# Patient Record
Sex: Male | Born: 1935 | Race: White | Hispanic: No | Marital: Married | Smoking: Former smoker
Health system: Southern US, Community
[De-identification: ages and names within clinical notes are randomized; demographics above are authoritative.]

## PROBLEM LIST (undated history)

## (undated) DIAGNOSIS — I209 Angina pectoris, unspecified: Secondary | ICD-10-CM

## (undated) DIAGNOSIS — I2510 Atherosclerotic heart disease of native coronary artery without angina pectoris: Secondary | ICD-10-CM

## (undated) DIAGNOSIS — N183 Chronic kidney disease, stage 3 unspecified: Secondary | ICD-10-CM

## (undated) DIAGNOSIS — I10 Essential (primary) hypertension: Secondary | ICD-10-CM

## (undated) DIAGNOSIS — K219 Gastro-esophageal reflux disease without esophagitis: Secondary | ICD-10-CM

## (undated) DIAGNOSIS — C8590 Non-Hodgkin lymphoma, unspecified, unspecified site: Secondary | ICD-10-CM

## (undated) DIAGNOSIS — I701 Atherosclerosis of renal artery: Secondary | ICD-10-CM

## (undated) DIAGNOSIS — J449 Chronic obstructive pulmonary disease, unspecified: Secondary | ICD-10-CM

## (undated) DIAGNOSIS — R42 Dizziness and giddiness: Secondary | ICD-10-CM

## (undated) HISTORY — PX: OTHER SURGICAL HISTORY: SHX169

## (undated) HISTORY — PX: LUNG SURGERY: SHX703

## (undated) HISTORY — DX: Chronic kidney disease, stage 3 (moderate): N18.3

## (undated) HISTORY — DX: Dizziness and giddiness: R42

## (undated) HISTORY — DX: Essential (primary) hypertension: I10

## (undated) HISTORY — PX: CORONARY ANGIOPLASTY WITH STENT PLACEMENT: SHX49

## (undated) HISTORY — DX: Gastro-esophageal reflux disease without esophagitis: K21.9

## (undated) HISTORY — DX: Non-Hodgkin lymphoma, unspecified, unspecified site: C85.90

## (undated) HISTORY — DX: Atherosclerotic heart disease of native coronary artery without angina pectoris: I25.10

## (undated) HISTORY — DX: Chronic kidney disease, stage 3 unspecified: N18.30

## (undated) HISTORY — PX: APPENDECTOMY: SHX54

## (undated) HISTORY — PX: HEMORROIDECTOMY: SUR656

## (undated) HISTORY — PX: MOUTH SURGERY: SHX715

## (undated) HISTORY — DX: Atherosclerosis of renal artery: I70.1

---
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
## 1998-08-10 HISTORY — PX: CATARACT EXTRACTION: SUR2

## 1999-04-20 ENCOUNTER — Encounter: Payer: Self-pay | Admitting: Emergency Medicine

## 1999-04-20 ENCOUNTER — Inpatient Hospital Stay (HOSPITAL_COMMUNITY): Admission: EM | Admit: 1999-04-20 | Discharge: 1999-04-22 | Payer: Self-pay | Admitting: Emergency Medicine

## 2000-02-18 ENCOUNTER — Encounter: Payer: Self-pay | Admitting: Family Medicine

## 2000-02-18 ENCOUNTER — Encounter (INDEPENDENT_AMBULATORY_CARE_PROVIDER_SITE_OTHER): Payer: Self-pay | Admitting: Specialist

## 2000-02-18 ENCOUNTER — Observation Stay (HOSPITAL_COMMUNITY): Admission: RE | Admit: 2000-02-18 | Discharge: 2000-02-19 | Payer: Self-pay | Admitting: Family Medicine

## 2000-02-19 ENCOUNTER — Encounter: Payer: Self-pay | Admitting: Family Medicine

## 2000-02-20 ENCOUNTER — Ambulatory Visit (HOSPITAL_COMMUNITY): Admission: RE | Admit: 2000-02-20 | Discharge: 2000-02-20 | Payer: Self-pay | Admitting: Family Medicine

## 2000-02-20 ENCOUNTER — Encounter: Payer: Self-pay | Admitting: Family Medicine

## 2000-02-24 ENCOUNTER — Encounter: Payer: Self-pay | Admitting: Family Medicine

## 2000-02-24 ENCOUNTER — Ambulatory Visit (HOSPITAL_COMMUNITY): Admission: RE | Admit: 2000-02-24 | Discharge: 2000-02-24 | Payer: Self-pay | Admitting: Family Medicine

## 2000-02-27 ENCOUNTER — Encounter: Payer: Self-pay | Admitting: Internal Medicine

## 2000-02-27 ENCOUNTER — Ambulatory Visit (HOSPITAL_COMMUNITY): Admission: RE | Admit: 2000-02-27 | Discharge: 2000-02-27 | Payer: Self-pay | Admitting: Internal Medicine

## 2000-04-02 ENCOUNTER — Encounter: Payer: Self-pay | Admitting: Thoracic Surgery

## 2000-04-05 ENCOUNTER — Encounter (INDEPENDENT_AMBULATORY_CARE_PROVIDER_SITE_OTHER): Payer: Self-pay | Admitting: Specialist

## 2000-04-05 ENCOUNTER — Inpatient Hospital Stay (HOSPITAL_COMMUNITY): Admission: RE | Admit: 2000-04-05 | Discharge: 2000-04-09 | Payer: Self-pay | Admitting: Thoracic Surgery

## 2000-04-05 ENCOUNTER — Encounter: Payer: Self-pay | Admitting: Thoracic Surgery

## 2000-04-06 ENCOUNTER — Encounter: Payer: Self-pay | Admitting: Thoracic Surgery

## 2000-04-07 ENCOUNTER — Encounter: Payer: Self-pay | Admitting: Thoracic Surgery

## 2000-04-08 ENCOUNTER — Encounter: Payer: Self-pay | Admitting: Thoracic Surgery

## 2000-04-14 ENCOUNTER — Encounter: Payer: Self-pay | Admitting: Thoracic Surgery

## 2000-04-14 ENCOUNTER — Encounter: Admission: RE | Admit: 2000-04-14 | Discharge: 2000-04-14 | Payer: Self-pay | Admitting: Thoracic Surgery

## 2000-05-05 ENCOUNTER — Encounter: Payer: Self-pay | Admitting: Thoracic Surgery

## 2000-05-05 ENCOUNTER — Encounter: Admission: RE | Admit: 2000-05-05 | Discharge: 2000-05-05 | Payer: Self-pay | Admitting: Thoracic Surgery

## 2000-07-07 ENCOUNTER — Encounter: Payer: Self-pay | Admitting: Thoracic Surgery

## 2000-07-07 ENCOUNTER — Encounter: Admission: RE | Admit: 2000-07-07 | Discharge: 2000-07-07 | Payer: Self-pay | Admitting: Thoracic Surgery

## 2000-10-20 ENCOUNTER — Encounter: Payer: Self-pay | Admitting: Thoracic Surgery

## 2000-10-20 ENCOUNTER — Encounter: Admission: RE | Admit: 2000-10-20 | Discharge: 2000-10-20 | Payer: Self-pay | Admitting: Thoracic Surgery

## 2001-01-28 ENCOUNTER — Encounter: Payer: Self-pay | Admitting: Thoracic Surgery

## 2001-01-28 ENCOUNTER — Encounter: Admission: RE | Admit: 2001-01-28 | Discharge: 2001-01-28 | Payer: Self-pay | Admitting: Thoracic Surgery

## 2003-01-18 ENCOUNTER — Encounter: Admission: RE | Admit: 2003-01-18 | Discharge: 2003-01-18 | Payer: Self-pay | Admitting: Family Medicine

## 2003-01-18 ENCOUNTER — Encounter: Payer: Self-pay | Admitting: Family Medicine

## 2003-11-28 ENCOUNTER — Encounter: Admission: RE | Admit: 2003-11-28 | Discharge: 2003-11-28 | Payer: Self-pay | Admitting: Nephrology

## 2003-12-09 ENCOUNTER — Ambulatory Visit (HOSPITAL_COMMUNITY): Admission: RE | Admit: 2003-12-09 | Discharge: 2003-12-09 | Payer: Self-pay | Admitting: Orthopedic Surgery

## 2004-01-18 ENCOUNTER — Emergency Department (HOSPITAL_COMMUNITY): Admission: EM | Admit: 2004-01-18 | Discharge: 2004-01-18 | Payer: Self-pay | Admitting: Emergency Medicine

## 2004-05-02 ENCOUNTER — Ambulatory Visit (HOSPITAL_COMMUNITY): Admission: RE | Admit: 2004-05-02 | Discharge: 2004-05-02 | Payer: Self-pay | Admitting: Ophthalmology

## 2004-06-24 ENCOUNTER — Ambulatory Visit: Payer: Self-pay | Admitting: Cardiology

## 2004-06-26 ENCOUNTER — Ambulatory Visit: Payer: Self-pay | Admitting: Cardiology

## 2004-06-27 ENCOUNTER — Ambulatory Visit (HOSPITAL_COMMUNITY): Admission: RE | Admit: 2004-06-27 | Discharge: 2004-06-28 | Payer: Self-pay | Admitting: Cardiology

## 2005-08-08 ENCOUNTER — Emergency Department (HOSPITAL_COMMUNITY): Admission: EM | Admit: 2005-08-08 | Discharge: 2005-08-08 | Payer: Self-pay | Admitting: Emergency Medicine

## 2006-08-27 ENCOUNTER — Ambulatory Visit: Payer: Self-pay

## 2008-01-24 ENCOUNTER — Encounter (INDEPENDENT_AMBULATORY_CARE_PROVIDER_SITE_OTHER): Payer: Self-pay | Admitting: Otolaryngology

## 2008-01-24 ENCOUNTER — Ambulatory Visit (HOSPITAL_BASED_OUTPATIENT_CLINIC_OR_DEPARTMENT_OTHER): Admission: RE | Admit: 2008-01-24 | Discharge: 2008-01-24 | Payer: Self-pay | Admitting: Otolaryngology

## 2008-02-08 ENCOUNTER — Ambulatory Visit: Payer: Self-pay | Admitting: Oncology

## 2008-02-17 LAB — COMPREHENSIVE METABOLIC PANEL
ALT: 13 U/L (ref 0–53)
AST: 15 U/L (ref 0–37)
Albumin: 4.2 g/dL (ref 3.5–5.2)
CO2: 29 mEq/L (ref 19–32)
Calcium: 9.3 mg/dL (ref 8.4–10.5)
Chloride: 103 mEq/L (ref 96–112)
Potassium: 4.5 mEq/L (ref 3.5–5.3)
Sodium: 141 mEq/L (ref 135–145)
Total Protein: 6.8 g/dL (ref 6.0–8.3)

## 2008-02-17 LAB — CBC WITH DIFFERENTIAL/PLATELET
BASO%: 0.4 % (ref 0.0–2.0)
Basophils Absolute: 0 10*3/uL (ref 0.0–0.1)
EOS%: 1.3 % (ref 0.0–7.0)
HCT: 38.5 % — ABNORMAL LOW (ref 38.7–49.9)
MCH: 34.5 pg — ABNORMAL HIGH (ref 28.0–33.4)
MCHC: 35 g/dL (ref 32.0–35.9)
MONO#: 0.6 10*3/uL (ref 0.1–0.9)
NEUT#: 4.9 10*3/uL (ref 1.5–6.5)
NEUT%: 71.4 % (ref 40.0–75.0)
Platelets: 187 10*3/uL (ref 145–400)
WBC: 6.9 10*3/uL (ref 4.0–10.0)
lymph#: 1.2 10*3/uL (ref 0.9–3.3)

## 2008-02-17 LAB — LACTATE DEHYDROGENASE: LDH: 159 U/L (ref 94–250)

## 2008-02-24 ENCOUNTER — Ambulatory Visit (HOSPITAL_COMMUNITY): Admission: RE | Admit: 2008-02-24 | Discharge: 2008-02-24 | Payer: Self-pay | Admitting: Oncology

## 2008-06-15 ENCOUNTER — Ambulatory Visit: Payer: Self-pay | Admitting: Oncology

## 2008-06-19 LAB — COMPREHENSIVE METABOLIC PANEL
ALT: 8 U/L (ref 0–53)
AST: 16 U/L (ref 0–37)
CO2: 31 mEq/L (ref 19–32)
Glucose, Bld: 92 mg/dL (ref 70–99)
Sodium: 143 mEq/L (ref 135–145)
Total Bilirubin: 0.8 mg/dL (ref 0.3–1.2)
Total Protein: 6.9 g/dL (ref 6.0–8.3)

## 2008-06-19 LAB — CBC WITH DIFFERENTIAL/PLATELET
Basophils Absolute: 0 10*3/uL (ref 0.0–0.1)
EOS%: 1.2 % (ref 0.0–7.0)
Eosinophils Absolute: 0.1 10*3/uL (ref 0.0–0.5)
HGB: 13.9 g/dL (ref 13.0–17.1)
LYMPH%: 26.2 % (ref 14.0–48.0)
MONO#: 0.5 10*3/uL (ref 0.1–0.9)
NEUT%: 63.6 % (ref 40.0–75.0)
RBC: 4.05 10*6/uL — ABNORMAL LOW (ref 4.20–5.71)
WBC: 6 10*3/uL (ref 4.0–10.0)

## 2008-12-17 ENCOUNTER — Ambulatory Visit: Payer: Self-pay | Admitting: Oncology

## 2008-12-19 LAB — CBC WITH DIFFERENTIAL/PLATELET
Basophils Absolute: 0 10*3/uL (ref 0.0–0.1)
EOS%: 1.5 % (ref 0.0–7.0)
HGB: 13.7 g/dL (ref 13.0–17.1)
MONO#: 0.3 10*3/uL (ref 0.1–0.9)
MONO%: 5.3 % (ref 0.0–14.0)
NEUT#: 4.8 10*3/uL (ref 1.5–6.5)
NEUT%: 73.2 % (ref 39.0–75.0)
RBC: 3.97 10*6/uL — ABNORMAL LOW (ref 4.20–5.82)

## 2008-12-19 LAB — COMPREHENSIVE METABOLIC PANEL
ALT: 12 U/L (ref 0–53)
AST: 15 U/L (ref 0–37)
Albumin: 4.1 g/dL (ref 3.5–5.2)
Alkaline Phosphatase: 69 U/L (ref 39–117)
Glucose, Bld: 150 mg/dL — ABNORMAL HIGH (ref 70–99)
Total Protein: 6.6 g/dL (ref 6.0–8.3)

## 2008-12-19 LAB — LACTATE DEHYDROGENASE: LDH: 164 U/L (ref 94–250)

## 2009-05-20 ENCOUNTER — Ambulatory Visit: Payer: Self-pay | Admitting: Oncology

## 2009-05-22 LAB — CBC WITH DIFFERENTIAL/PLATELET
BASO%: 0.5 % (ref 0.0–2.0)
Basophils Absolute: 0 10*3/uL (ref 0.0–0.1)
EOS%: 1.4 % (ref 0.0–7.0)
Eosinophils Absolute: 0.1 10*3/uL (ref 0.0–0.5)
LYMPH%: 21.4 % (ref 14.0–49.0)
MCH: 34.3 pg — ABNORMAL HIGH (ref 27.2–33.4)
MCHC: 34.2 g/dL (ref 32.0–36.0)
MONO#: 0.6 10*3/uL (ref 0.1–0.9)
MONO%: 9 % (ref 0.0–14.0)
NEUT%: 67.7 % (ref 39.0–75.0)
RBC: 4 10*6/uL — ABNORMAL LOW (ref 4.20–5.82)

## 2009-05-22 LAB — COMPREHENSIVE METABOLIC PANEL
AST: 16 U/L (ref 0–37)
Alkaline Phosphatase: 73 U/L (ref 39–117)
CO2: 29 mEq/L (ref 19–32)
Glucose, Bld: 124 mg/dL — ABNORMAL HIGH (ref 70–99)
Potassium: 4.3 mEq/L (ref 3.5–5.3)
Total Bilirubin: 0.7 mg/dL (ref 0.3–1.2)

## 2009-05-22 LAB — LACTATE DEHYDROGENASE: LDH: 167 U/L (ref 94–250)

## 2009-11-19 ENCOUNTER — Ambulatory Visit: Payer: Self-pay | Admitting: Oncology

## 2009-11-21 LAB — CBC WITH DIFFERENTIAL/PLATELET
Eosinophils Absolute: 0.1 10*3/uL (ref 0.0–0.5)
MCH: 34.3 pg — ABNORMAL HIGH (ref 27.2–33.4)
MCHC: 33.8 g/dL (ref 32.0–36.0)
MCV: 101.6 fL — ABNORMAL HIGH (ref 79.3–98.0)
RDW: 14.3 % (ref 11.0–14.6)
lymph#: 1 10*3/uL (ref 0.9–3.3)

## 2009-11-21 LAB — COMPREHENSIVE METABOLIC PANEL
ALT: 9 U/L (ref 0–53)
AST: 14 U/L (ref 0–37)
Albumin: 4.4 g/dL (ref 3.5–5.2)
CO2: 26 mEq/L (ref 19–32)
Calcium: 9.4 mg/dL (ref 8.4–10.5)
Creatinine, Ser: 2.35 mg/dL — ABNORMAL HIGH (ref 0.40–1.50)
Glucose, Bld: 94 mg/dL (ref 70–99)
Potassium: 4.3 mEq/L (ref 3.5–5.3)
Sodium: 139 mEq/L (ref 135–145)
Total Protein: 6.9 g/dL (ref 6.0–8.3)

## 2010-01-21 ENCOUNTER — Ambulatory Visit: Payer: Self-pay | Admitting: Oncology

## 2010-01-23 LAB — CBC WITH DIFFERENTIAL/PLATELET
BASO%: 0.5 % (ref 0.0–2.0)
EOS%: 2.1 % (ref 0.0–7.0)
LYMPH%: 21.5 % (ref 14.0–49.0)
MCH: 34.7 pg — ABNORMAL HIGH (ref 27.2–33.4)
MCHC: 34.6 g/dL (ref 32.0–36.0)
MCV: 100.4 fL — ABNORMAL HIGH (ref 79.3–98.0)
MONO#: 0.7 10*3/uL (ref 0.1–0.9)
MONO%: 10 % (ref 0.0–14.0)
NEUT#: 4.5 10*3/uL (ref 1.5–6.5)
NEUT%: 65.9 % (ref 39.0–75.0)

## 2010-01-23 LAB — COMPREHENSIVE METABOLIC PANEL
ALT: 9 U/L (ref 0–53)
AST: 16 U/L (ref 0–37)
BUN: 26 mg/dL — ABNORMAL HIGH (ref 6–23)
CO2: 26 mEq/L (ref 19–32)
Calcium: 8.9 mg/dL (ref 8.4–10.5)
Glucose, Bld: 86 mg/dL (ref 70–99)
Potassium: 4.5 mEq/L (ref 3.5–5.3)
Sodium: 142 mEq/L (ref 135–145)

## 2010-02-05 ENCOUNTER — Ambulatory Visit (HOSPITAL_COMMUNITY): Admission: RE | Admit: 2010-02-05 | Discharge: 2010-02-05 | Payer: Self-pay | Admitting: Oncology

## 2010-05-20 ENCOUNTER — Ambulatory Visit: Payer: Self-pay | Admitting: Oncology

## 2010-05-22 LAB — CBC WITH DIFFERENTIAL/PLATELET
BASO%: 0.3 % (ref 0.0–2.0)
Basophils Absolute: 0 10*3/uL (ref 0.0–0.1)
EOS%: 1 % (ref 0.0–7.0)
HGB: 13.6 g/dL (ref 13.0–17.1)
MCV: 100.2 fL — ABNORMAL HIGH (ref 79.3–98.0)
MONO#: 0.6 10*3/uL (ref 0.1–0.9)
MONO%: 8.8 % (ref 0.0–14.0)
RDW: 14 % (ref 11.0–14.6)
WBC: 6.4 10*3/uL (ref 4.0–10.3)

## 2010-05-22 LAB — COMPREHENSIVE METABOLIC PANEL
ALT: <8 U/L (ref 0–53)
AST: 16 U/L (ref 0–37)
Alkaline Phosphatase: 73 U/L (ref 39–117)
BUN: 29 mg/dL — ABNORMAL HIGH (ref 6–23)
Chloride: 101 mEq/L (ref 96–112)
Creatinine, Ser: 1.62 mg/dL — ABNORMAL HIGH (ref 0.40–1.50)
Sodium: 141 mEq/L (ref 135–145)
Total Bilirubin: 0.7 mg/dL (ref 0.3–1.2)

## 2010-05-22 LAB — LACTATE DEHYDROGENASE: LDH: 170 U/L (ref 94–250)

## 2010-08-22 ENCOUNTER — Inpatient Hospital Stay (HOSPITAL_COMMUNITY)
Admission: EM | Admit: 2010-08-22 | Discharge: 2010-08-25 | Payer: Self-pay | Source: Home / Self Care | Attending: Internal Medicine | Admitting: Internal Medicine

## 2010-08-25 LAB — URINALYSIS, ROUTINE W REFLEX MICROSCOPIC
Bilirubin Urine: NEGATIVE
Hgb urine dipstick: NEGATIVE
Ketones, ur: NEGATIVE mg/dL
Leukocytes, UA: NEGATIVE
Nitrite: NEGATIVE
Protein, ur: 30 mg/dL — AB
Specific Gravity, Urine: 1.013 (ref 1.005–1.030)
Urine Glucose, Fasting: 100 mg/dL — AB
Urobilinogen, UA: 0.2 mg/dL (ref 0.0–1.0)
pH: 6 (ref 5.0–8.0)

## 2010-08-25 LAB — CBC
HCT: 41.6 % (ref 39.0–52.0)
Hemoglobin: 13.7 g/dL (ref 13.0–17.0)
MCH: 33.3 pg (ref 26.0–34.0)
MCHC: 32.9 g/dL (ref 30.0–36.0)
MCV: 101.2 fL — ABNORMAL HIGH (ref 78.0–100.0)
Platelets: 183 10*3/uL (ref 150–400)
RBC: 4.11 MIL/uL — ABNORMAL LOW (ref 4.22–5.81)
RDW: 13.5 % (ref 11.5–15.5)
WBC: 6.9 10*3/uL (ref 4.0–10.5)

## 2010-08-25 LAB — BASIC METABOLIC PANEL
BUN: 16 mg/dL (ref 6–23)
CO2: 32 mEq/L (ref 19–32)
Calcium: 9.1 mg/dL (ref 8.4–10.5)
Chloride: 101 mEq/L (ref 96–112)
Creatinine, Ser: 1.48 mg/dL (ref 0.4–1.5)
GFR calc Af Amer: 54 mL/min — ABNORMAL LOW (ref >60)
GFR calc non Af Amer: 45 mL/min — ABNORMAL LOW (ref >60)
Glucose, Bld: 95 mg/dL (ref 70–99)
Potassium: 3.9 mEq/L (ref 3.5–5.1)
Sodium: 139 mEq/L (ref 135–145)

## 2010-08-25 LAB — POCT I-STAT, CHEM 8
BUN: 26 mg/dL — ABNORMAL HIGH (ref 6–23)
Calcium, Ion: 1.1 mmol/L — ABNORMAL LOW (ref 1.12–1.32)
Chloride: 106 mEq/L (ref 96–112)
Creatinine, Ser: 1.6 mg/dL — ABNORMAL HIGH (ref 0.4–1.5)
Glucose, Bld: 123 mg/dL — ABNORMAL HIGH (ref 70–99)
HCT: 42 % (ref 39.0–52.0)
Hemoglobin: 14.3 g/dL (ref 13.0–17.0)
Potassium: 4.1 mEq/L (ref 3.5–5.1)
Sodium: 138 mEq/L (ref 135–145)
TCO2: 28 mmol/L (ref 0–100)

## 2010-08-25 LAB — DIFFERENTIAL
Basophils Absolute: 0 10*3/uL (ref 0.0–0.1)
Basophils Relative: 0 % (ref 0–1)
Eosinophils Absolute: 0 10*3/uL (ref 0.0–0.7)
Eosinophils Relative: 0 % (ref 0–5)
Lymphocytes Relative: 12 % (ref 12–46)
Lymphs Abs: 0.9 10*3/uL (ref 0.7–4.0)
Monocytes Absolute: 0.4 10*3/uL (ref 0.1–1.0)
Monocytes Relative: 5 % (ref 3–12)
Neutro Abs: 5.6 10*3/uL (ref 1.7–7.7)
Neutrophils Relative %: 82 % — ABNORMAL HIGH (ref 43–77)

## 2010-08-25 LAB — URINE MICROSCOPIC-ADD ON

## 2010-08-27 LAB — BASIC METABOLIC PANEL
BUN: 23 mg/dL (ref 6–23)
CO2: 32 mEq/L (ref 19–32)
Calcium: 9.2 mg/dL (ref 8.4–10.5)
Chloride: 97 mEq/L (ref 96–112)
Creatinine, Ser: 1.64 mg/dL — ABNORMAL HIGH (ref 0.4–1.5)
GFR calc Af Amer: 48 mL/min — ABNORMAL LOW (ref >60)
GFR calc non Af Amer: 40 mL/min — ABNORMAL LOW (ref >60)
Glucose, Bld: 89 mg/dL (ref 70–99)
Potassium: 3.8 mEq/L (ref 3.5–5.1)
Sodium: 136 mEq/L (ref 135–145)

## 2010-08-27 LAB — CBC
HCT: 39.6 % (ref 39.0–52.0)
Hemoglobin: 12.9 g/dL — ABNORMAL LOW (ref 13.0–17.0)
MCH: 32.9 pg (ref 26.0–34.0)
MCHC: 32.6 g/dL (ref 30.0–36.0)
MCV: 101 fL — ABNORMAL HIGH (ref 78.0–100.0)
Platelets: 197 10*3/uL (ref 150–400)
RBC: 3.92 MIL/uL — ABNORMAL LOW (ref 4.22–5.81)
RDW: 13.4 % (ref 11.5–15.5)
WBC: 6 10*3/uL (ref 4.0–10.5)

## 2010-08-27 LAB — PHOSPHORUS: Phosphorus: 3.5 mg/dL (ref 2.3–4.6)

## 2010-08-27 LAB — MAGNESIUM: Magnesium: 2.1 mg/dL (ref 1.5–2.5)

## 2010-08-28 ENCOUNTER — Ambulatory Visit: Payer: Self-pay | Admitting: Cardiology

## 2010-09-01 NOTE — H&P (Addendum)
NAMECHEYNE, [name] NO.:  [id number]  MEDICAL RECORD NO.:  [id number]          PATIENT TYPE:  EMS  LOCATION:  MAJO                         FACILITY:  [facility]  PHYSICIAN:  Houston Siren, MD           DATE OF BIRTH:  1919-01-17  DATE OF ADMISSION:  08/22/2010 DATE OF DISCHARGE:                             HISTORY & PHYSICAL   PRIMARY CARE PHYSICIAN:  Benita Stabile, M.D.  REASON FOR ADMISSION:  Vertigo.  ADVANCED DIRECTIVE:  Full Code.  HISTORY OF PRESENT ILLNESS:  This is an 77 year old male with a history of hypertension, GERD, gout, prior vertigo who presented to the emergency room with acute onset of vertigo and nausea.  He had sudden onset of vertigo with some nausea but no vomiting.  He denies any headache but has trouble focusing.  He noted that he had some nystagmus and definitely worsened vertigo with head movements.  He did not have any upper respiratory symptoms prior to this.  He denied any focal weakness or slurred speech.  He has no headache, palpitation, chest pain, shortness of breath, or any other symptomatology.  A year ago, he had similar symptoms and was given intravenous in the emergency room and sent home as well.  Workup in the emergency room included an EKG which shows sinus rhythm, QTC of 420 milliseconds.  A head CT only shows small- vessel disease.  He does have slight elevation of his creatinine of 1.6. He has normal white count and normal hemoglobin.  Hospitalist was asked to admit the patient because he is unable to ambulate after Antivert and Zofran given.  PAST MEDICAL HISTORY:  Hypertension, acid reflux, arrhythmia, gout, prostatic hypertrophy.  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  He is married with children.  He denied current tobacco or alcohol or drug use.  ALLERGIES:  PENICILLIN.  CURRENT MEDICATIONS: 1. Lisinopril. 2. Omeprazole. 3. Colchicine. 4. Tamsulosin. 5. Terazosin. 6.  Hydrochlorothiazide. 7. Norvasc. 8. Lopressor. 9. Allopurinol. 10.Aspirin. 11.Ranitidine. 12.Neurontin.  REVIEW OF SYSTEMS:  Otherwise unremarkable.  PHYSICAL EXAM:  VITAL SIGNS:  Blood pressure 140/70, heart rate of 75, respiratory rate of 18, temperature 97.5. GENERAL APPEARANCE:  Exam shows that he is alert and oriented and conversing.  He has no nystagmus.  He has facial symmetry and speech is fluent.  Tongue is midline.  Uvula elevated with phonation. NECK:  He has no carotid bruit.  Neck is supple. CARDIAC:  Exam reveals S1, S2 regular.  I do not hear any murmur, rub, or gallop. LUNGS:  Clear. ABDOMINAL:  Exam is soft, nondistended, nontender.  No rebound.  No palpable mass.  No ascites. EXTREMITIES:  Shows no edema.  No calf tenderness.  Good distal pulses bilaterally. NEUROLOGICAL:  Exam shows finger-to-nose and heel-to-shin are intact. He has good strength bilaterally.  Babinski is flexor, and his strength is equal bilaterally.  OBJECTIVE FINDINGS:  White count of 6.9 thousand, hemoglobin of 13.7. Head CT showed vascular calcification and small-vessel disease. Creatinine of 1.6, BUN of 26, hemoglobin of 14.3.  EKG shows sinus rhythm  with no acute ST-T changes.  IMPRESSIONS:  This is an 77 year old male with prior history of vertigo presents most likely peripheral vertigo.  He does have nystagmus with head movement and worsening symptoms with head movement as well.  We will admit him because he cannot ambulate.  We will treat him symptomatically with Antivert and benzodiazepines as needed.  I will continue his aspirin.  It should be noted that his cerebellar exam is intact.  As soon as his medications reconciled, we will continue them as well.  I noted that he is on 2 alpha blockers, and we will only continue him on one.  He also has elevation of creatinine.  We will give him intravenous fluid, and we will hold his hydrochlorothiazide.  If his creatinine does not  improve, will then initiate further workups.  We also should adjust his allopurinol and hold his colchicine due to elevation of his creatinine.  He is stable and will be admitted to [facility].  CODE STATUS:  He is a Full Code.     Houston Siren, MD     PL/MEDQ  D:  08/22/2010  T:  08/22/2010  Job:  [id number]  Electronically Signed by Houston Siren  on 09/01/2010 09:25:21 PM

## 2010-09-03 NOTE — Discharge Summary (Addendum)
[name], [name]                 ACCOUNT NO.:  [id number]  MEDICAL RECORD NO.:  [id number]          PATIENT TYPE:  INP  LOCATION:  3707                         FACILITY:  [facility]  PHYSICIAN:  Rock Nephew, MD       DATE OF BIRTH:  Apr 30, 1919  DATE OF ADMISSION:  08/22/2010 DATE OF DISCHARGE:  08/25/2010                        DISCHARGE SUMMARY - REFERRING   PRIMARY CARE PHYSICIAN:  Dr. Peggye Form.  DISCHARGE DIAGNOSES: 1. Vertigo most likely exacerbated by taking an extra dose of     gabapentin. 2. History of hypertension, controlled. 3. History of GERD. 4. History of gout. 5. History of BPH. 6. History of chronic kidney disease stage III with mild exacerbation. 7. History of coronary artery disease.  DISCHARGE MEDICATIONS: 1. Gabapentin 100 mg 2 tablets daily at bedtime q.h.s. for neuropathy. 2. Imdur 30 mg p.o. daily. 3. Senna 2 tablets by mouth daily. 4. Allopurinol 100 mg p.o. every morning. 5. Amlodipine 2.5 mg p.o. daily. 6. Aspirin 81 mg p.o. daily. 7. Colchicine 0.6 mg 1 tablet by mouth twice daily. 8. As needed hydrochlorothiazide 12.5 mg by mouth every other day. 9. Metoprolol 25 mg 1/2 capsule by mouth daily. 10.Multivitamins 1 tablet by mouth daily at bedtime. 11.Omeprazole 20 mg 1 capsule by mouth daily at bedtime. 12.Ranitidine 150 mg p.o. twice daily. 13.Tamsulosin 0.4 mg 1 tablet by mouth daily at bedtime. 14.Terazosin 5 mg p.o. daily at bedtime. 15.Tylenol Extra Strength 500 mg 1 tablet by mouth daily at bedtime. 16.Vitamin D3 over-the-counter 1 tablet by mouth p.o. daily.  DISPOSITION:  The patient is discharged home.  The patient will have home safety evaluation at home.  PATIENT'S DIET:  Heart-healthy.  PROCEDURES PERFORMED:  Patient had a CT scan of the head which showed small vessel disease.  No intracranial hemorrhage or CT evidence of large acute infarct, vascular calcifications.  CONSULTATIONS:  On this case none.  FOLLOW-UP:   The patient should follow up with Dr. Roger Shelter in about 1 week.  The patient should also follow up with Dr. Lupe Carney is about 1-2 weeks.  The patient also should follow up with Dr. Arrie Aran in 1-3 weeks.  INITIAL HISTORY AND PHYSICAL/CHIEF COMPLAINT:  Vertigo.  77- year-old male with a history of hypertension, GERD, gout, prior vertigo who presented to the emergency room with acute onset of vertigo and nausea.  He denies any headache or any trouble focusing. In speaking to the patient, the patient reported that he thinks he took he extra dose of gabapentin in the morning.  The patient's vertigo resolved on its own.  HOSPITAL COURSE: 1. Vertigo.  Again the patient's gabapentin dose was decreased.  He     takes gabapentin for peripheral neuropathy.  The patient remained     vertigo free. 2. Hypertension.  Patient's blood pressure was elevated.  Initially     the patient's hydrochlorothiazide was withheld.  The patient's     lisinopril was also withheld.  Looking at the patient's creatinine,     patient has an elevated creatinine.  Patient's creatinine was     initially 1.6.  Then it  was 1.48, 1.6.  That is what appears to be     around baseline creatinine for the patient.  Patient most likely     has chronic kidney disease stage III.  Currently I have withheld     the patient's lisinopril, and has transitioned him over to Imdur.     The patient could most likely be transitioned back to lisinopril if     Dr. Arrie Aran thinks it is okay.  I have urged the patient to     follow up with Dr. Arrie Aran in about 1-3 weeks. 3. GERD.  Patient was continued on PPI. 4. Gout.  Patient continued on allopurinol.  Patient also became a     little bit constipated.  He thought it was secondary to     allopurinol, and that is why he was given senna and resolved the     problem. 5. BPH.  The patient takes Flomax as well as Hytrin. 6. Chronic kidney disease stage III.  Again  patient's creatinine     appears relatively stable.  Patient should have an outpatient     follow-up with Dr. Arrie Aran. 7. Coronary artery disease.  Patient is on aspirin as well as a beta     blocker.  He received heparin for DVT prophylaxis.  Please note that this is not an official document until electronically signed     Rock Nephew, MD     NH/MEDQ  D:  08/25/2010  T:  08/25/2010  Job:  [id number]  cc:   Peggye Form, M.D. Colleen Can. Deborah Chalk, M.D. Terrial Rhodes, M.D.  Electronically Signed by Rock Nephew MD on 09/03/2010 06:24:57 PM

## 2010-09-04 ENCOUNTER — Ambulatory Visit: Payer: Self-pay | Admitting: Cardiovascular Disease

## 2010-09-20 ENCOUNTER — Emergency Department (HOSPITAL_COMMUNITY)
Admission: EM | Admit: 2010-09-20 | Discharge: 2010-09-20 | Disposition: A | Discharge status: Home / Self Care | Payer: Medicare Other | Attending: Emergency Medicine | Admitting: Emergency Medicine

## 2010-09-20 ENCOUNTER — Emergency Department (HOSPITAL_COMMUNITY): Payer: Medicare Other

## 2010-09-20 DIAGNOSIS — K219 Gastro-esophageal reflux disease without esophagitis: Secondary | ICD-10-CM | POA: Insufficient documentation

## 2010-09-20 DIAGNOSIS — S99929A Unspecified injury of unspecified foot, initial encounter: Secondary | ICD-10-CM | POA: Insufficient documentation

## 2010-09-20 DIAGNOSIS — M79609 Pain in unspecified limb: Secondary | ICD-10-CM | POA: Insufficient documentation

## 2010-09-20 DIAGNOSIS — Z862 Personal history of diseases of the blood and blood-forming organs and certain disorders involving the immune mechanism: Secondary | ICD-10-CM | POA: Insufficient documentation

## 2010-09-20 DIAGNOSIS — Y92009 Unspecified place in unspecified non-institutional (private) residence as the place of occurrence of the external cause: Secondary | ICD-10-CM | POA: Insufficient documentation

## 2010-09-20 DIAGNOSIS — S8990XA Unspecified injury of unspecified lower leg, initial encounter: Secondary | ICD-10-CM | POA: Insufficient documentation

## 2010-09-20 DIAGNOSIS — M7989 Other specified soft tissue disorders: Secondary | ICD-10-CM | POA: Insufficient documentation

## 2010-09-20 DIAGNOSIS — I10 Essential (primary) hypertension: Secondary | ICD-10-CM | POA: Insufficient documentation

## 2010-09-20 DIAGNOSIS — Z79899 Other long term (current) drug therapy: Secondary | ICD-10-CM | POA: Insufficient documentation

## 2010-09-20 DIAGNOSIS — Z8639 Personal history of other endocrine, nutritional and metabolic disease: Secondary | ICD-10-CM | POA: Insufficient documentation

## 2010-09-20 DIAGNOSIS — M19079 Primary osteoarthritis, unspecified ankle and foot: Secondary | ICD-10-CM | POA: Insufficient documentation

## 2010-09-20 DIAGNOSIS — Z7982 Long term (current) use of aspirin: Secondary | ICD-10-CM | POA: Insufficient documentation

## 2010-09-20 DIAGNOSIS — IMO0002 Reserved for concepts with insufficient information to code with codable children: Secondary | ICD-10-CM | POA: Insufficient documentation

## 2010-10-14 ENCOUNTER — Ambulatory Visit: Payer: Self-pay | Admitting: *Deleted

## 2010-10-14 ENCOUNTER — Ambulatory Visit (INDEPENDENT_AMBULATORY_CARE_PROVIDER_SITE_OTHER): Payer: Medicare Other | Admitting: Cardiology

## 2010-10-14 DIAGNOSIS — I2510 Atherosclerotic heart disease of native coronary artery without angina pectoris: Secondary | ICD-10-CM

## 2010-10-14 DIAGNOSIS — I10 Essential (primary) hypertension: Secondary | ICD-10-CM

## 2010-11-17 ENCOUNTER — Telehealth: Payer: Self-pay | Admitting: Cardiology

## 2010-11-17 NOTE — Telephone Encounter (Signed)
CALL PT REGARDING A QUESTION ABOUT HIS MEDS.

## 2010-11-17 NOTE — Telephone Encounter (Signed)
Pt called, wanting to know if Dr. Deborah Chalk stopped Lisinopril.  RN reviewed records and could not see why Lisinopril was stopped.  [facility] will [facility] to see if they stopped Lis[facility]ril.  Pt will call back with any concerns.

## 2010-12-03 ENCOUNTER — Encounter (HOSPITAL_BASED_OUTPATIENT_CLINIC_OR_DEPARTMENT_OTHER): Payer: Medicare Other | Admitting: Oncology

## 2010-12-03 ENCOUNTER — Other Ambulatory Visit: Payer: Self-pay | Admitting: Oncology

## 2010-12-03 DIAGNOSIS — N189 Chronic kidney disease, unspecified: Secondary | ICD-10-CM

## 2010-12-03 DIAGNOSIS — C8291 Follicular lymphoma, unspecified, lymph nodes of head, face, and neck: Secondary | ICD-10-CM

## 2010-12-03 LAB — CBC WITH DIFFERENTIAL/PLATELET
Eosinophils Absolute: 0.1 10*3/uL (ref 0.0–0.5)
HCT: 38.2 % — ABNORMAL LOW (ref 38.4–49.9)
HGB: 12.9 g/dL — ABNORMAL LOW (ref 13.0–17.1)
LYMPH%: 27.5 % (ref 14.0–49.0)
MCHC: 33.8 g/dL (ref 32.0–36.0)
MCV: 100.7 fL — ABNORMAL HIGH (ref 79.3–98.0)
MONO#: 0.5 10*3/uL (ref 0.1–0.9)
RDW: 14.6 % (ref 11.0–14.6)

## 2010-12-03 LAB — COMPREHENSIVE METABOLIC PANEL
CO2: 28 mEq/L (ref 19–32)
Potassium: 4.4 mEq/L (ref 3.5–5.3)
Total Bilirubin: 0.5 mg/dL (ref 0.3–1.2)
Total Protein: 6.7 g/dL (ref 6.0–8.3)

## 2010-12-23 NOTE — Op Note (Signed)
NAME:  [name], [name]                 ACCOUNT NO.:  [id number]   MEDICAL RECORD NO.:  [id number]          PATIENT TYPE:  AMB   LOCATION:  DSC                          FACILITY:  [facility]   PHYSICIAN:  Christopher E. Ezzard Standing, M.D.DATE OF BIRTH:  1918-10-15   DATE OF PROCEDURE:  01/24/2008  DATE OF DISCHARGE:                               OPERATIVE REPORT   PREOPERATIVE DIAGNOSIS:  Right floor of mouth nodule 1 x 1.5 cm size.   POSTOPERATIVE DIAGNOSIS:  Right floor of mouth nodule 1 x 1.5 cm size.   OPERATION:  Excision of right floor of mouth nodule with sialolithotomy.   SURGEON:  Kristine Garbe. Ezzard Standing, MD   ANESTHESIA:  Local, 1% Xylocaine with epinephrine.   COMPLICATIONS:  None.   BRIEF CLINICAL NOTE:  [name] is an 77 year old gentleman who has  noted a mass or nodule in the right side of his floor of mouth now for  several months.  He feels like it interferes with his speech a little  bit and is also bothersome.  On exam in the office, he has approximately  1-1/2 cm palpable nodule in the right floor of mouth along the region of  the right submandibular duct.  He is taken to the operating room at this  time for excision of right floor of mouth nodule.   DESCRIPTION OF PROCEDURE:  The patient was brought back to the operating  room.  The floor of mouth was injected with approximately 4 mL Xylocaine  with epinephrine for local anesthetic and then using cautery to cut the  mucosa, the mucosa was opened up overlying the nodule and the nodule was  dissected out, removed, and sent to pathology.  Hemostasis was obtained  with cautery.  No closure was performed.  On clinical exam, the patient  had an opening of the submandibular duct proximal to the nodule where  apparently the duct had visualized.  There was no swelling of the  submandibular gland prior to procedure.  The patient tolerated this  well.  He was subsequently discharged home.   DISPOSITION:  The patient is discharged  home on Keflex 500 mg b.i.d. for  5 days, Tylenol p.r.n. pain.  Instructed on rinsing the mouth when  eating and will have follow-up in my office in 6 days for recheck and  review pathology.           ______________________________  Kristine Garbe Ezzard Standing, M.D.     CEN/MEDQ  D:  01/24/2008  T:  01/25/2008  Job:  [id number]   cc:   L. Lupe Carney, M.D.

## 2010-12-26 NOTE — Op Note (Signed)
[place]  Patient:    [name], [name]                        MRN: [id number] Proc. Date: 04/05/00 Adm. Date:  [id number] Attending:  Cameron Proud CC:         Norton Blizzard, M.D. x 2             Clinton D. Maple Hudson, M.D.                           Operative Report  PREOPERATIVE DIAGNOSIS:  Left upper lobe lesion.  POSTOPERATIVE DIAGNOSIS:  Left upper lobe lesion.  PROCEDURE:  Left VATS, wedge resection of left upper lobe lesion.  SURGEON:  D. Karle Plumber, M.D.  ASSISTANT:  Areta Haber, P.A.  ANESTHESIA:  General anesthesia.  INDICATIONS:  This patient had a previous left upper lobe lesion and he had been a chronic smoker that had quit several years earlier.  Needle biopsy of this lesion revealed benign, but they did not feel they had gotten a good biopsy of this.  Because of this, it was elected to do a bronchial mediastinoscopy and then a wedge resection, however, in reviewing the chest x-rays after the needle biopsy, the lesion was stable and there was minimal adenopathy, so it was decided to go ahead and just do a straight wedge resection of this.  DESCRIPTION OF PROCEDURE:  After general anesthesia, the patient was turned to the left lateral thoracotomy position and was prepped and draped in the usual sterile fashion.  Two trocar sites were made in the anterior and posterior axillary line at the seventh intercostal space and the midaxillary line at the fifth intercostal space.  The trocars were inserted, the lesion was seen in the anterior segment of the left upper lobe.  Through the midtrocar site, it was grasped with a Duvall lung clamp and then resected with the autosuture 60 stapler with three applications of the autosuture 60 stapler and one application of the 45.  Then working through the mid trocar site, a 10R node was removed.  Frozen section revealed a probable inflammatory lesion.  There was some question that  it may be bronchovial cancer, but since we had a generous wedge, it was decided to stop there.  Two chest tubes were placed through the trocar sites and tied in place with 0 silk.  The midtrocar site was closed with one pericostal #1 Vicryl in the muscle layer, 2-0 Vicryl in the subcuticular stitch, and Steri-Strips.  The patient was returned to the recovery room in stable condition. DD:  04/05/00 TD:  04/05/00 Job: [id number]

## 2010-12-26 NOTE — Op Note (Signed)
[name], [name] NO.:  [id number]   MEDICAL RECORD NO.:  [id number]          PATIENT TYPE:  OIB   LOCATION:  2899                         FACILITY:  [facility]   PHYSICIAN:  Guadelupe Sabin, M.D.DATE OF BIRTH:  March 09, 1919   DATE OF PROCEDURE:  05/02/2004  DATE OF DISCHARGE:  05/02/2004                                 OPERATIVE REPORT   PREOPERATIVE DIAGNOSIS:  Senile nuclear cataract right eye.   POSTOPERATIVE DIAGNOSIS:  Senile nuclear cataract right eye.   NAME OF OPERATION:  Planned extracapsular cataract extraction -  phacoemulsification, primary insertion of posterior chamber intraocular lens  implant.   SURGEON:  Jacklin.   ASSISTANT:  Nurse.   ANESTHESIA:  1.  Local 4% Xylocaine, 0.75 Marcaine retrobulbar block with Wydase added.  2.  Topical tetracaine.  3.  Intraocular Xylocaine.   ANESTHESIA:  Standby required in this elderly patient.  Patient given sodium  Pentothal intravenously during the period of retrobulbar blocking.   OPERATIVE PROCEDURE:  After the patient was prepped and draped, a lid  speculum was inserted in the right eye.  The eye was turned downward and a  superior rectus traction suture placed.  Schiotz tonometry was recorded at 5  scale units with a 5.5g weight.  A peritomy was performed adjacent to the  limbus from 11 to 1 o'clock position.  The corneoscleral junction was  cleaned and a corneoscleral groove made with a 45 degree Superblade.  The  anterior chamber was then entered with the 2.5 mm diamond keratome at the 12  o'clock position and the 15 degree blade at the 2:30 position.  Using a bent  26 gauge needle on an OCUCOAT syringe, a circular capsulorrhexis was begun  and then completed with the Grabow forceps.  A capsulotomy was quite  irregular and the capsule very tense.  Hydrodissection and hydrodelineation  were performed using 1% Xylocaine.  The 30 degree phacoemulsification tip  was then inserted with slow,  controlled emulsification and back  fragmentation of the lens nucleus with the Beckert pick.  Total ultrasonic  time 1 minute 15 seconds, average power level 15%, total amount of fluid  used 95 mL.  Following removal of the nucleus, the residual cortex was  aspirated with the irrigation, aspiration tip.  The posterior  capsule  appeared intact with a brilliant red fundus reflex.  It was, therefore,  elected to insert an __________ medical optic [id number] MB silicone 3-piece  posterior chamber intraocular lens implant, diopter strength +20.00.  This  was inserted with the McDonald forceps into the anterior chamber and then  centered into the capsular bag using the [facility] lens rotator.  The lens  appeared to be well centered.  The Healon an OCUCOAT, which had been used  intermittently, was aspirated and replaced with balanced salt solution and  Miochol ophthalmic solution.  The operative incisions appeared to be Beil-  healing, but it was elected to place a single 10-0 interrupted nylon suture  across the 12 o'clock incision to ensure closure and to prevent  endophthalmitis.  Maxitrol ointment was instilled in  the conjunctival cul-de-sac and a light patch and protector shield applied.  Duration of procedure and anesthesia administration 45 minutes.  Patient  tolerated the procedure well in general, left the operating room for the  recovery room in good condition.       HNJ/MEDQ  D:  05/03/2004  T:  05/04/2004  Job:  [id number]

## 2010-12-26 NOTE — H&P (Signed)
[name], [name] NO.:  [id number]   MEDICAL RECORD NO.:  [id number]          PATIENT TYPE:  OIB   LOCATION:  2899                         FACILITY:  [facility]   PHYSICIAN:  Salvadore Farber, M.D. LHCDATE OF BIRTH:  July 07, 1919   DATE OF ADMISSION:  06/27/2004  DATE OF DISCHARGE:                                HISTORY & PHYSICAL   CHIEF COMPLAINT:  Renal insufficiency/renal artery stenosis.   HISTORY OF PRESENT ILLNESS:  [name] is an 77 year old male patient with a  history of coronary artery disease.  He is followed by Dr. Terrial Rhodes  for chronic renal failure.  A MRA of the abdomen was performed which showed  critical stenosis in the left renal artery and he was evaluated by Dr.  Salvadore Farber as part of the Coral study.   [name] has no recent chest pain or shortness of breath.  He does have some  claudication symptoms and he has had hypertension for greater than 30 years.  He has had multiple coronary interventions by Dr. Colleen Can. Deborah Chalk, but  within the last year, he has had a normal stress test.  Dr. Salvadore Farber evaluated [name] and felt that he was a good candidate for the  Coral study and he was enrolled.  He is here today for peripheral vascular  angiogram and possible intervention.   PAST MEDICAL HISTORY:  1.  Status post multiple cardiac catheterization with stenting, last      procedure September 2001.  2.  Status post left lung resection for a benign lesion in 2002.  3.  Hypertension.  4.  Gastroesophageal reflux disease.  5.  Hyperlipidemia.  6.  Cataracts.  7.  History of malaria as a young teenager.  8.  Varicose veins.  9.  Hemorrhoids.  10. BPH.  11. History of impotence.  12. Renal insufficiency.   PAST SURGICAL HISTORY:  1.  Catheterizations.  2.  Tonsillectomy.  3.  Appendectomy.  4.  Circumcision.  5.  Lung resection.  6.  Right shoulder surgery.   ALLERGIES:  PENICILLIN.   MEDICATIONS:  1.   Ranitidine 150 mg 2 tablets b.i.d.  2.  Simvastatin 20 mg q.d.  3.  Terazosin 5 mg q.d.  4.  Lisinopril 20 mg q.d.  5.  Salicylate 250 mg b.i.d.  6.  Felodipine 5 mg q.d.  7.  HCTZ 12.5 mg q.d.  8.  Vitamin E q.d.  9.  Aspirin 81 mg q.d.   FAMILY HISTORY:  His father died at age 33 from a MI and CVA.  His mother  died at age 6 of no known heart disease.  He has a brother that died at age  84 from lung cancer and one that died of a brain tumor, but one brother is  alive and has a history of heart disease.   SOCIAL HISTORY:  [place].  He  quit tobacco over 20 years ago.  He does not abuse alcohol or drugs.  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   REVIEW OF SYMPTOMS:  The patient has occasional difficulty voiding.  He has  arthralgias.  He denies any hematemesis, hemoptysis, or melena.  He has not  had any chest pain.  Review of systems is otherwise negative.   PHYSICAL EXAMINATION:  VITAL SIGNS:  Temperature is 96.6, blood pressure  159/82, heart rate 86, respiratory rate 16.  O2 saturation 97% on room air.  GENERAL:  He is a well-developed, elderly white male in no acute distress.  HEENT:  He is head is normocephalic and atraumatic with pupils are equal,  round, and reactive to light and accommodation.  Extraocular movements  intact.  NECK:  There is no thyromegaly or JVD noted.  He has a soft bruit on the  left carotid and no bruits appreciated on the right.  CARDIOVASCULAR:  His heart is regular in rate and rhythm with no significant  murmur, rub, or gallop noted.  CHEST:  Clear to auscultation bilaterally.  ABDOMEN:  Soft and nontender with active bowel sounds.  EXTREMITIES:  There is no edema.  No femoral bruits are appreciated.  He has  2+ pulses in all four extremities.  NEUROLOGICAL:  He is alert and oriented with cranial nerves II through XII  grossly intact.   LABORATORY DATA:  Hemoglobin 14.3, hematocrit 42.2, WBC 6.1,  platelets  241,000.  INR 0.9, PTT 26.3.  Sodium 140, potassium 3.8, chloride 103, CO2  33, BUN 27, creatinine 1.9, glucose 122, calcium 9.3.   ASSESSMENT/PLAN:  1.  Left renal artery stenosis:  On Coral trial.  He is for angiogram and      possible PTA today.  2.  Coronary artery disease:  Status post percutaneous coronary intervention      by Dr. Colleen Can. Deborah Chalk last in      2000.  Follow up with Dr. Colleen Can. [place].  3.  Hypertension:  The patient's blood pressure is at his baseline today and      he will follow up with primary care and Dr. Colleen Can. [place].      Rhon   RB/MEDQ  D:  06/27/2004  T:  06/27/2004  Job:  [id number]   cc:   Terrial Rhodes, M.D.  [address]  [place]  [place] [address]  Fax: [phone]   Colleen Can. Deborah Chalk, M.D.  Fax: [phone]

## 2010-12-26 NOTE — Discharge Summary (Signed)
[place]. [facility]  Patient:    DESTEN, MANOR                        MRN: [id number] Adm. Date:  [id number] Disc. Date: [id number] Attending:  Cameron Proud Dictator:   Carlye Grippe. CC:         Clinton D. Maple Hudson, M.D.   Discharge Summary  DATE OF BIRTH:  01/11/1919  HISTORY OF PRESENT ILLNESS:  This is a 77 year old male referred by Clinton D. Maple Hudson, M.D., to D. Karle Plumber, M.D., for evaluation of a left lung mass. The patient has had episodes in April of this year of mild costal pain.  A chest x-ray revealed a left upper lobe spiculated lesion and this was confirmed on CT scan.  Appearance was felt to be consistent with carcinoma, although the patient did have a noted previous biopsy that was negative.  He was admitted this hospitalization for video-assisted thoracoscopy and possible further resection depending on findings.  PAST MEDICAL HISTORY:  Coronary artery disease.  Hypertension. Osteoarthritis.  Cataracts.  Gastroesophageal reflux.  History of malaria at age 89.  History of varicose veins.  Hemorrhoids.  Benign prostatic hypertrophy.  Impotence.  PAST SURGICAL HISTORY:  Tonsillectomy.  Appendectomy.  Hemorrhoidectomy. Circumcision.  Lung biopsy in 2001.  Previous cardiac catheterization with stenting in September of 2000.  He also had a catheterization in 1993.  ALLERGIES:  PENICILLIN.  MEDICATIONS ON ADMISSION: 1. Celebrex 200 mg q.d. 2. Imdur 30 mg q.d. 3. Enteric-coated aspirin 81 mg q.d. 4. Terazosin 5 mg q.d. 5. Amlodipine 5 mg q.d. 6. Vitamin E 400 iu one dose q.d. 7. Ranitidine 300 mg b.i.d. 8. Simvastatin 20 mg q.p.m. 9. Multivitamins with zinc and folic acid one q.d.  FAMILY HISTORY:  Please see the history and physical done at the time of admission.  SOCIAL HISTORY:  Please see the history and physical done at the time of admission.  REVIEW OF SYSTEMS:  Please see the history and physical done at the  time of admission.  PHYSICAL EXAMINATION:  Please see the history and physical done at the time of admission.  HOSPITAL COURSE:  The patient was admitted electively and taken to the operating room where he underwent left video-assisted thoracoscopy with left upper lobe wedge resection.  Initial frozen section showed a benign appearance.  The patient was taken to the post anesthesia care unit in stable condition.  The pathology has revealed a benign lesion.  There was evidence of localized organizing pneumonitis.  The patient has tolerated routine advancement in activities.  The lines, monitors, and tubes have all been discontinued also in a routine manner.  The incision is healing well without signs of infection.  Laboratory values are felt to be stable.  The most labs dated March 18, 2000, are as follows: Sodium 134, potassium 4.4, chloride 95, carbon dioxide 34, glucose 109, BUN 15, creatinine 1.3, albumin 2.8, total protein 5.6.  White blood cell count 7.9, hemoglobin 11.4, hematocrit 32.4, platelet count 172.  Currently the patient is afebrile with stable hemodynamics.  Oxygen saturations are 95% on 1 L.  He does have some cough and congestion, but is showing good results from routine pulmonary toilet.  His activities have been increased as tolerated.  Tentatively he is felt to be stable for discharge in the morning by D. Karle Plumber, M.D., on April 09, 2000, pending morning round reevaluation.  CONDITION ON DISCHARGE:  Stable  and improving.  DISCHARGE MEDICATIONS:  1. Celebrex 200 mg q.d.  2. Imdur 30 mg q.d.  3. Enteric-coated aspirin 81 mg q.d.  4. Terazosin 5 mg q.d.  5. Amlodipine 5 mg q.d.  6. Vitamin E 400 iu one dose q.d.  7. Ranitidine 300 mg b.i.d.  8. Simvastatin 20 mg q.p.m.  9. Multivitamins with zinc and folic acid one q.d. 10. Tylox one to two q.4-6h. p.r.n. as needed for pain.  FINAL DIAGNOSES: 1. Left upper lobe mass consistent with localized  organizing pneumonitis.  No    evidence of malignancy. 2. Coronary artery disease, status post previous percutaneous transluminal    coronary angioplasty and stenting in 1993 and 2000.  DISCHARGE INSTRUCTIONS:  The patient will receive written instructions in regard to medications, activity, diet, wound care, and follow-up.  FOLLOW-UP:  Norton Blizzard, M.D., on Wednesday, April 14, 2000, at 11:30 a.m. DD:  04/08/00 TD:  04/09/00 Job: [id number]

## 2010-12-26 NOTE — Discharge Summary (Signed)
[name], [name] NO.:  [id number]   MEDICAL RECORD NO.:  [id number]            PATIENT TYPE:   LOCATION:                                 FACILITY:   PHYSICIAN:  Salvadore Farber, M.D. LHCDATE OF BIRTH:  23-Nov-1918   DATE OF ADMISSION:  06/27/2004  DATE OF DISCHARGE:  06/28/2004                                 DISCHARGE SUMMARY   PROCEDURES:  1.  Bilateral renal angiogram.  2. Left renal stent using filter wire.   HOSPITAL COURSE:  Problem 1. [name] is an 77 year old male with known  coronary artery disease.  As part of his evaluation for hypertension, he had  an MRA of the abdomen showing critical stenosis of the left renal artery.  He was evaluated by Dr. Samule Ohm in the office and it was felt that angiogram  and possible percutaneous intervention was needed.  He was admitted for this  on 06/27/04.   Dr. Samule Ohm found single renal arteries bilaterally.  On the left there was a  95% stenosis that was treated with PTA and stent.  He has Plavix added to  his medication regimen for 30 days and is to be on aspirin indefinitely.   [name] has baseline renal insufficiency.  His creatinine at admission was  1.9.  He is to follow up with Dr. Samule Ohm in the office and get a BMET in one  week.  He is also to see the PA in 7-10 days.  After that, he will follow up  with Dr. Samule Ohm in three to six months and with Dr. Arrie Aran as well as  his primary care physician.  He has been enrolled in the [place] trial.  If  his kidney function is at his baseline on 06/28/04 and his groin is stable,  he is tentatively considered stable for discharge with outpatient followup  arranged.   DISCHARGE CONDITION:  Stable.   DISCHARGE DIAGNOSES:  1.  Left renal artery stenosis, status post percutaneous transluminal      angioplasty and stent.  2. History of coronary artery disease with cath      and interventions by Dr. Deborah Chalk.  3. Status post tonsillectomy.  4.      Status post  appendectomy.  5. Status post circumcision.  6. Status post      left lung resection in 2002 secondary to a benign lesion.  7. Right      shoulder surgery.  8. Hypertension.  9. Reflux.  10. Hyperlipidemia.      11. Allergy to penicillin.  12. Remote history of tobacco use (quit 22      years ago).   FAMILY HISTORY:  Coronary artery disease.   DISCHARGE INSTRUCTIONS:  His activity level is to include no driving for two  days and no strenuous activity for a week.   FOLLOWUP:  He is to follow up with Dr. Samule Ohm in the office and will call.  He is to follow up with Dr. Deborah Chalk and Dr. Arrie Aran as scheduled.   DISCHARGE MEDICATIONS:  1.  Plavix 75 mg daily.  2. Felodipine 5 mg q.a.m.  3. Ranitidine 150 mg two      tablets b.i.d.  4. Vitamin E daily.  5. Terazosin 5 mg q.p.m.  6.      Simvastatin 20 mg q.h.s.  7. Aspirin 81 mg daily.      Rhon   RB/MEDQ  D:  06/27/2004  T:  06/28/2004  Job:  [id number]   cc:   Terrial Rhodes, M.D.  [address]  [place]  [place] [address]  Fax: [phone]   Colleen Can. Deborah Chalk, M.D.  Fax: [phone]   Salvadore Farber, M.D. [facility]  [address]  [address]  [place]  [place] [address]

## 2010-12-26 NOTE — Op Note (Signed)
[name], MORONTA                 ACCOUNT NO.:  [id number]   MEDICAL RECORD NO.:  [id number]          PATIENT TYPE:  OIB   LOCATION:  5501                         FACILITY:  [facility]   PHYSICIAN:  Salvadore Farber, M.D. LHCDATE OF BIRTH:  1919-05-24   DATE OF PROCEDURE:  06/27/2004  DATE OF DISCHARGE:  06/28/2004                                 OPERATIVE REPORT   PROCEDURE PERFORMED:  Selective bilateral renal angiography, left renal  artery stenting using filter wire distal protection.   INDICATIONS FOR PROCEDURE:  Mr. Hazzard is an 77 year old gentleman with  longstanding hypertension and progressive renal insufficiency.  He underwent  MRA of the abdomen demonstrating critical stenosis of the left renal artery.  The right renal was normal by MR.  He was referred by Terrial Rhodes,  M.D. for renal angiography with an eye to percutaneous revascularization.   DESCRIPTION OF PROCEDURE:  Informed consent was obtained.  Under 1%  lidocaine local anesthesia, a 6 French sheath was placed in the right common  femoral artery using a modified Seldinger technique.  A pigtail catheter was  advanced into the suprarenal abdominal aorta.  Abdominal aortography was  performed using carbon dioxide for a contrast agent.  This confirmed the MR  demonstration of single renal arteries bilaterally.  A 5 French LIMA  catheter was then used to selectively engage the right renal artery.  Angiography was performed using hand injection of gadolinium.  This  demonstrated the artery to be normal.  Catheter was then used to selectively  engage the left renal artery.  Hand injection of gadolinium was again  performed demonstrating a 90 to 95% stenosis of the ostium of the vessel.   Decision was made to proceed with percutaneous revascularization.  Anticoagulation was initiated with heparin to achieve an ACT of greater than  250 seconds.  A 6 French LIMA guide was advanced over the wire and engaged  in the ostium  of the left renal.  Filter wire was advanced without  difficulty beyond the lesion and the filter deployed proximal to the  bifurcation of the renal artery.  The lesion was then predilated using a 5 x  15 mm Aviator at 8 atmospheres.  The lesion was then stented using a 6 x 18  mm EB3 Paramount stent deployed at 8 atmospheres. The ostium was flared at  12 atmospheres.  Repeat angiography demonstrated no residual stenosis, no  dissection, and normal flow through the renal parenchyma without evidence of  distal embolization.  Filter was then removed without difficulty.  The  patient tolerated the procedure well and was transferred to the holding room  in stable condition.  No iodinated contrast was used during the procedure.   COMPLICATIONS:  None.   FINDINGS:  1.  Right renal artery:  Single vessel which is angiographically normal.  2.  Left renal artery:  Single vessel with 90 to 95% ostial stenosis treated      to no residual stenosis using a 6 x 18 mm Paramount stent.   IMPRESSION/RECOMMENDATIONS:  Successful stenting of the left renal artery.  Patient  will be observed overnight.  Creatinine checked in the morning.  He  should be continued on Plavix for 30 days.  Aspirin will be continued  indefinitely.      Will   WED/MEDQ  D:  06/27/2004  T:  06/28/2004  Job:  [id number]   cc:   L. Lupe Carney, M.D.  [address] [place]  [place] [address]  Fax: [phone]   Terrial Rhodes, M.D.  [address]  [place]  [place] [address]  Fax: [phone]   Colleen Can. Deborah Chalk, M.D.  Fax: [phone]

## 2010-12-26 NOTE — H&P (Signed)
[name], [name] NO.:  [id number]   MEDICAL RECORD NO.:  [id number]          PATIENT TYPE:  OIB   LOCATION:  2899                         FACILITY:  [facility]   PHYSICIAN:  Guadelupe Sabin, M.D.DATE OF BIRTH:  January 29, 1919   DATE OF ADMISSION:  05/02/2004  DATE OF DISCHARGE:  05/02/2004                                HISTORY & PHYSICAL   This was a planned outpatient admission of this 77 year old white male  admitted for cataract implant surgery of the right eye.   HISTORY OF PRESENT ILLNESS:  This patient has been followed in my office  since May 21, 1998.  At that time when first seen, the patient was noted  to have nuclear and anterior subcapsular cataract formation in both eyes  with a vision of 20/40.  Over the ensuing years, the patient has had several  refractions and issued new spectacle lenses.  Recently, however, when the  patient returned on April 16, 2004, he felt that his vision in the right  eye had become much worse, and he elected to proceed with cataract implant  surgery.  The patient at that time noted smoky, blurred, and dimmed vision  in the right eye with difficulty driving and seeing road signs, difficulty  with seeing television, difficulty reading, trouble seeing at night, and  trouble seeing during bright sunlight.  The patient also was having  difficulty with his work and hobbies.  Colors appeared dim in the right eye,  and depth perception was off.  He signed the informed consent, and  arrangements were made for his outpatient admission at this time.   PAST MEDICAL HISTORY:  The patient is under the care of Dr. Lupe Carney,  his primary care physician, and Dr. Delfin Edis, his cardiologist, and Dr.  Arrie Aran, his nephrologist.  The patient takes multiple medications for  his deteriorating chronic renal disease.  The patient states that he may  require surgery in the future.  The patient, by Dr.  Deborah Chalk, is felt to be  in  satisfactory condition for the proposed surgery under local anesthesia.  The patient has had previous stent surgery in 2000 and previous surgery in  1993.  The patient has had a recent stress test.   REVIEW OF SYSTEMS:  No current cardiorespiratory complaints.   PHYSICAL EXAMINATION:  GENERAL:  The patient is a pleasant 77 year old white  male in no acute distress.  HEENT:  Eyes:  Visual acuity 20/60- right eye, 20/20 left eye.  Applanation  tonometry 14 mm each eye.  Slit lamp examination:  The eyes are white and  clear with clear cornea, deep and clear anterior chamber.  A slightly  brunescent nuclear cataract is seen in both eyes.  Dilated fundus  examination shows cataract haze greater in the right than left eye.  The  vitreous is clear.  The retina is attached.  The optic nerve is sharply  outlined, good color.  Disk cup ratio 0.5.  Blood vessels and retina appear  normal in macula of both eyes.  CHEST:  Lungs clear to percussion and auscultation.  HEART:  Normal sinus rhythm, no cardiomegaly, no murmurs.  ABDOMEN:  Negative.  EXTREMITIES:  Negative.   ADMISSION DIAGNOSIS:  Senile nuclear cataract, right eye.   SURGICAL PLAN:  Cataract implant surgery, right eye.       HNJ/MEDQ  D:  05/03/2004  T:  05/03/2004  Job:  [id number]   cc:   L. Lupe Carney, M.D.  [address] [place]  [place] [address]  Fax: [phone]   Colleen Can. Deborah Chalk, M.D.  Fax: [phone]   Terrial Rhodes, M.D.  [address]  [place]  [place] [address]  Fax: [phone]

## 2011-02-21 ENCOUNTER — Other Ambulatory Visit: Payer: Self-pay | Admitting: Cardiology

## 2011-02-23 ENCOUNTER — Telehealth: Payer: Self-pay | Admitting: Cardiology

## 2011-02-23 ENCOUNTER — Other Ambulatory Visit: Payer: Self-pay | Admitting: *Deleted

## 2011-02-23 MED ORDER — METOPROLOL SUCCINATE ER 25 MG PO TB24: ORAL_TABLET | ORAL | Status: DC

## 2011-02-23 NOTE — Telephone Encounter (Signed)
Metoprolol [facility] mg. [facility] [address] [phone].

## 2011-02-23 NOTE — Telephone Encounter (Signed)
Placed on prescription to keep app w new cardiologist, refill given to get through to next app.

## 2011-02-23 NOTE — Telephone Encounter (Signed)
msg on prescription to keep app w dr hochrein, refill completed to get through to next app.Alfonso Ramus RN

## 2011-03-17 ENCOUNTER — Telehealth: Payer: Self-pay | Admitting: Cardiology

## 2011-03-17 NOTE — Telephone Encounter (Signed)
Is very upset about changes. States he got recall letter to see Dr. Antoine Poche and when he called to make an app no one "knew what they were doing". Advised Dr. Deborah Chalk went thru every patient's chart and assigned them to a physician. Advised we were all [facility] now. Said he would call them back. He doesn't want to be with "a doctor not in this building" . Again advised we would all be part of one practice in a few months.

## 2011-03-17 NOTE — Telephone Encounter (Signed)
Pt received Recall Letter to make appt with Dr. Antoine Poche under [facility] [facility].  Pt was a former patient of Dr. Deborah Chalk.  Pt wants to stay with [facility] and not [place].  Tried to explain to patient the change.  Pt only wants to re-establish care with a physician here in this office and not Dr. Antoine Poche.  Pt requests to speak to nurse because he is confused why his recall letter said [facility] and the envelope says [facility], and why Dr. Antoine Poche is not in our clinic.  Please call pt back.

## 2011-05-01 ENCOUNTER — Encounter: Payer: Self-pay | Admitting: Cardiology

## 2011-05-01 ENCOUNTER — Encounter: Payer: Self-pay | Admitting: *Deleted

## 2011-05-04 ENCOUNTER — Ambulatory Visit: Payer: Medicare Other | Admitting: Cardiology

## 2011-05-04 ENCOUNTER — Ambulatory Visit (INDEPENDENT_AMBULATORY_CARE_PROVIDER_SITE_OTHER): Payer: Medicare Other | Admitting: Cardiology

## 2011-05-04 ENCOUNTER — Encounter: Payer: Self-pay | Admitting: Cardiology

## 2011-05-04 DIAGNOSIS — I519 Heart disease, unspecified: Secondary | ICD-10-CM | POA: Insufficient documentation

## 2011-05-04 DIAGNOSIS — I10 Essential (primary) hypertension: Secondary | ICD-10-CM | POA: Insufficient documentation

## 2011-05-04 DIAGNOSIS — Z299 Encounter for prophylactic measures, unspecified: Secondary | ICD-10-CM

## 2011-05-04 DIAGNOSIS — I701 Atherosclerosis of renal artery: Secondary | ICD-10-CM

## 2011-05-04 NOTE — Patient Instructions (Signed)
The current medical regimen is effective;  continue present plan and medications.  Follow up in 6 months with Dr Hochrein.  You will receive a letter in the mail 2 months before you are due.  Please call us when you receive this letter to schedule your follow up appointment.  

## 2011-05-04 NOTE — Assessment & Plan Note (Signed)
At this point he is having no active symptoms.  We talked about this for some time and I would probably reserve further stress or other testing for him if he has symptoms. Until then he will continue with risk reduction.   (Greater than 40 minutes reviewing all data with greater than 50% face to face with the patient).

## 2011-05-04 NOTE — Assessment & Plan Note (Signed)
I did note that he was not on a statin.  He says that he was taken off of Zocor because his lipids were so good. I don't see a recent lipid profile but this sounds plausible especially given his age.

## 2011-05-04 NOTE — Assessment & Plan Note (Signed)
His blood pressure is elevated today.  However, he claims that this is not usually the case.  He will keep an eye on this at home and will let me know if it remains high.  Until then he will continue with the meds as listed.

## 2011-05-04 NOTE — Progress Notes (Signed)
HPI The patient presents as a new patient to me having previously seen Dr. Deborah Chalk.  He has a history of CAD.  I reviewed Dr. Ronnald Nian extensive office chart and all available hospital electronic records.  Since last being seen in the cardiology office he has done well. I do note that he had a nuclear stress test the last that I can see in 2007.  He has been caring for his invalid wife since she had ataxia and falling down the stairs. He now lives in a retirement home with her. They walked slowly to the dining hall. He does other chores of daily living. With this he denies any chest pressure, neck or arm discomfort. He has some rare shortness of breath and anxious feelings and rarely takes a nitroglycerin. This has been a stable pattern. He denies any PND or orthopnea. He doesn't have any palpitations, presyncope or syncope.  Allergies  Allergen Reactions  . Penicillins     Current Outpatient Prescriptions  Medication Sig Dispense Refill  . amLODipine (NORVASC) 5 MG tablet Take 2.5 mg by mouth daily.        Marland Kitchen aspirin 81 MG tablet Take 81 mg by mouth daily.        . Cholecalciferol (VITAMIN D3) 1000 UNITS CAPS Take 1 capsule by mouth daily.        . hydrochlorothiazide (HYDRODIURIL) 25 MG tablet Take 12.5 mg by mouth every other day.        . metoprolol succinate (TOPROL XL) 25 MG 24 hr tablet 1/2 tablet po daily   45 tablet  0  . Multiple Vitamins-Minerals (ICAPS AREDS FORMULA PO) Take 1 capsule by mouth 2 (two) times daily.        . nitroGLYCERIN (NITROSTAT) 0.4 MG SL tablet Place 0.4 mg under the tongue every 5 (five) minutes as needed.        Marland Kitchen omeprazole (PRILOSEC) 20 MG capsule Take 20 mg by mouth daily.        . ranitidine (ZANTAC) 150 MG tablet Take 150 mg by mouth 2 (two) times daily.        Marland Kitchen terazosin (HYTRIN) 5 MG capsule Take 5 mg by mouth at bedtime.          Past Medical History  Diagnosis Date  . Heart disease     PCI  . Vertigo   . Lymphoma   . Hypertension   . GERD  (gastroesophageal reflux disease)   . Gout   . RAS (renal artery stenosis)     PCI left renal artery    Past Surgical History  Procedure Date  . Stent to the righ coronary in 2000     ROS: Post nasal drain, frequent urination.  Otherwise as stated in the HPI and negative for all other systems.   PHYSICAL EXAM BP 152/80  Pulse 62  Ht 5\' 8"  (1.727 m)  Wt 186 lb (84.369 kg)  BMI 28.28 kg/m2 GENERAL:  Well appearing HEENT:  Pupils equal round and reactive, fundi not visualized, oral mucosa unremarkable NECK:  No jugular venous distention, waveform within normal limits, carotid upstroke brisk and symmetric, no bruits, no thyromegaly LYMPHATICS:  No cervical, inguinal adenopathy LUNGS:  Clear to auscultation bilaterally BACK:  No CVA tenderness CHEST:  Unremarkable HEART:  PMI not displaced or sustained,S1 and S2 within normal limits, no S3, no S4, no clicks, no rubs, no murmurs ABD:  Flat, positive bowel sounds normal in frequency in pitch, no bruits, no rebound, no guarding,  no midline pulsatile mass, no hepatomegaly, no splenomegaly EXT:  2 plus pulses throughout, no edema, no cyanosis no clubbing, arthritic changes, trace edema SKIN:  No rashes no nodules NEURO:  Cranial nerves II through XII grossly intact, motor grossly intact throughout PSYCH:  Cognitively intact, oriented to person place and time   EKG:  Sinus rhythm, rate 62, axis within normal limits, intervals within normal limits, no acute ST-T wave changes.   ASSESSMENT AND PLAN

## 2011-05-06 ENCOUNTER — Telehealth: Payer: Self-pay | Admitting: Cardiology

## 2011-05-06 NOTE — Telephone Encounter (Signed)
Walk in Pt Form" Pt Dropped off Records for Hochrein Review" sent to [facility]  05/06/11/km

## 2011-05-20 ENCOUNTER — Other Ambulatory Visit: Payer: Self-pay | Admitting: Oncology

## 2011-05-20 ENCOUNTER — Encounter (HOSPITAL_BASED_OUTPATIENT_CLINIC_OR_DEPARTMENT_OTHER): Payer: Medicare Other | Admitting: Oncology

## 2011-05-20 DIAGNOSIS — N189 Chronic kidney disease, unspecified: Secondary | ICD-10-CM

## 2011-05-20 DIAGNOSIS — D7589 Other specified diseases of blood and blood-forming organs: Secondary | ICD-10-CM

## 2011-05-20 DIAGNOSIS — N289 Disorder of kidney and ureter, unspecified: Secondary | ICD-10-CM

## 2011-05-20 DIAGNOSIS — C8291 Follicular lymphoma, unspecified, lymph nodes of head, face, and neck: Secondary | ICD-10-CM

## 2011-05-20 LAB — COMPREHENSIVE METABOLIC PANEL
Albumin: 4.2 g/dL (ref 3.5–5.2)
BUN: 29 mg/dL — ABNORMAL HIGH (ref 6–23)
Calcium: 9.3 mg/dL (ref 8.4–10.5)
Chloride: 101 mEq/L (ref 96–112)
Creatinine, Ser: 1.79 mg/dL — ABNORMAL HIGH (ref 0.50–1.35)
Glucose, Bld: 91 mg/dL (ref 70–99)
Potassium: 4.6 mEq/L (ref 3.5–5.3)

## 2011-05-20 LAB — CBC WITH DIFFERENTIAL/PLATELET
Basophils Absolute: 0 10*3/uL (ref 0.0–0.1)
Eosinophils Absolute: 0.1 10*3/uL (ref 0.0–0.5)
HCT: 40.6 % (ref 38.4–49.9)
HGB: 13.8 g/dL (ref 13.0–17.1)
MCH: 35 pg — ABNORMAL HIGH (ref 27.2–33.4)
MCV: 102.9 fL — ABNORMAL HIGH (ref 79.3–98.0)
NEUT#: 3.9 10*3/uL (ref 1.5–6.5)
NEUT%: 64 % (ref 39.0–75.0)
RDW: 15 % — ABNORMAL HIGH (ref 11.0–14.6)
lymph#: 1.5 10*3/uL (ref 0.9–3.3)

## 2011-05-27 ENCOUNTER — Other Ambulatory Visit: Payer: Self-pay | Admitting: Cardiology

## 2011-05-27 MED ORDER — NITROGLYCERIN 0.4 MG SL SUBL: 0.4000 mg | SUBLINGUAL_TABLET | SUBLINGUAL | Status: DC | PRN

## 2011-05-27 NOTE — Telephone Encounter (Signed)
[facility] [address]

## 2011-05-29 ENCOUNTER — Encounter: Payer: Self-pay | Admitting: Cardiology

## 2011-10-06 ENCOUNTER — Encounter: Payer: Self-pay | Admitting: Cardiology

## 2011-10-15 ENCOUNTER — Ambulatory Visit (INDEPENDENT_AMBULATORY_CARE_PROVIDER_SITE_OTHER): Payer: Medicare Other | Admitting: Cardiology

## 2011-10-15 ENCOUNTER — Encounter: Payer: Self-pay | Admitting: Cardiology

## 2011-10-15 DIAGNOSIS — I519 Heart disease, unspecified: Secondary | ICD-10-CM

## 2011-10-15 DIAGNOSIS — I701 Atherosclerosis of renal artery: Secondary | ICD-10-CM

## 2011-10-15 DIAGNOSIS — E785 Hyperlipidemia, unspecified: Secondary | ICD-10-CM | POA: Insufficient documentation

## 2011-10-15 DIAGNOSIS — I10 Essential (primary) hypertension: Secondary | ICD-10-CM

## 2011-10-15 NOTE — Assessment & Plan Note (Signed)
He is having no unstable symptoms. We will continue with conservative management and no further testing is indicated at this point.

## 2011-10-15 NOTE — Assessment & Plan Note (Signed)
He had his lipids drawn recently at the date. His HDL was 60 with an LDL of 91. He is off therapy which was discontinued by Dr. Deborah Chalk.  I see no indication to restart this.

## 2011-10-15 NOTE — Progress Notes (Signed)
HPI The patient presents for follow up of CAD.  Since I last saw him he has done well.  The patient denies any new symptoms such as chest discomfort, neck or arm discomfort. There has been no new shortness of breath, PND or orthopnea. There have been no reported palpitations, presyncope or syncope.  He has had rare dyspnea at night when he gets up to go to the bathroom. He's had some rare fleeting chest pain. When he gets excited with his dyspnea he will take a nitroglycerin and has done this once in the last 3 months.  Allergies  Allergen Reactions  . Penicillins     Current Outpatient Prescriptions  Medication Sig Dispense Refill  . acetaminophen (TYLENOL) 500 MG tablet Take 500 mg by mouth every 6 (six) hours as needed.      Marland Kitchen allopurinol (ZYLOPRIM) 300 MG tablet Take 300 mg by mouth daily.      Marland Kitchen amLODipine (NORVASC) 5 MG tablet Take 2.5 mg by mouth daily.        Marland Kitchen aspirin 81 MG tablet Take 81 mg by mouth daily.        . Cholecalciferol (VITAMIN D3) 1000 UNITS CAPS Take 1 capsule by mouth daily.        . colchicine 0.6 MG tablet Take 0.6 mg by mouth as needed.      . fluticasone (FLONASE) 50 MCG/ACT nasal spray Place 2 sprays into the nose daily.      . hydrochlorothiazide (HYDRODIURIL) 25 MG tablet Take 12.5 mg by mouth every other day.        . metoprolol succinate (TOPROL XL) 25 MG 24 hr tablet 1/2 tablet po daily   45 tablet  0  . Multiple Vitamins-Minerals (ICAPS AREDS FORMULA PO) Take 1 capsule by mouth 2 (two) times daily.        . nitroGLYCERIN (NITROSTAT) 0.4 MG SL tablet Place 1 tablet (0.4 mg total) under the tongue every 5 (five) minutes as needed.  25 tablet  6  . NON FORMULARY 1 day or 1 dose. Calbetasol cream apply once daily      . NON FORMULARY Place 0.01 % into both ears daily. 3 drops to each ear      . omeprazole (PRILOSEC) 20 MG capsule Take 20 mg by mouth daily.        . ranitidine (ZANTAC) 150 MG tablet Take 150 mg by mouth 2 (two) times daily.        Marland Kitchen  terazosin (HYTRIN) 5 MG capsule Take 5 mg by mouth at bedtime.          Past Medical History  Diagnosis Date  . Heart disease     PCI/Stent RCA 2000  . Vertigo   . Lymphoma   . Hypertension   . GERD (gastroesophageal reflux disease)   . Gout   . RAS (renal artery stenosis)     PCI left renal artery 2005    Past Surgical History  Procedure Date  . Stent to the righ coronary in 2000    ROS: Post nasal drain, frequent urination.  Otherwise as stated in the HPI and negative for all other systems.  PHYSICAL EXAM BP 145/81  Pulse 72  Ht 5\' 8"  (1.727 m)  Wt 190 lb (86.183 kg)  BMI 28.89 kg/m2 GENERAL:  Well appearing HEENT:  Pupils equal round and reactive, fundi not visualized, oral mucosa unremarkable NECK:  No jugular venous distention, waveform within normal limits, carotid upstroke brisk and symmetric,  no bruits, no thyromegaly LYMPHATICS:  No cervical, inguinal adenopathy LUNGS:  Clear to auscultation bilaterally BACK:  No CVA tenderness CHEST:  Unremarkable HEART:  PMI not displaced or sustained,S1 and S2 within normal limits, no S3, no S4, no clicks, no rubs, no murmurs ABD:  Flat, positive bowel sounds normal in frequency in pitch, no bruits, no rebound, no guarding, no midline pulsatile mass, no hepatomegaly, no splenomegaly EXT:  2 plus pulses throughout, no edema, no cyanosis no clubbing, arthritic changes, trace edema  EKG:  Sinus rhythm, rate 69, axis within normal limits, intervals within normal limits, no acute ST-T wave changes.  10/15/2011  ASSESSMENT AND PLAN

## 2011-10-15 NOTE — Patient Instructions (Signed)
The current medical regimen is effective;  continue present plan and medications.  Follow up in 6 months with Lori Gerhardt.  You will receive a letter in the mail 2 months before you are due.  Please call us when you receive this letter to schedule your follow up appointment.  

## 2011-10-15 NOTE — Assessment & Plan Note (Signed)
Creat is 1.9.  He is followed by nephrology.

## 2011-10-15 NOTE — Assessment & Plan Note (Signed)
His blood pressure is better control. We will continue with meds as listed.

## 2011-12-17 ENCOUNTER — Ambulatory Visit: Payer: Medicare Other | Admitting: Oncology

## 2011-12-17 ENCOUNTER — Other Ambulatory Visit: Payer: Medicare Other | Admitting: Lab

## 2011-12-18 ENCOUNTER — Other Ambulatory Visit (HOSPITAL_BASED_OUTPATIENT_CLINIC_OR_DEPARTMENT_OTHER): Payer: Medicare Other | Admitting: Lab

## 2011-12-18 ENCOUNTER — Ambulatory Visit (HOSPITAL_BASED_OUTPATIENT_CLINIC_OR_DEPARTMENT_OTHER): Payer: Medicare Other | Admitting: Oncology

## 2011-12-18 VITALS — BP 148/81 | HR 69 | Temp 97.2°F | Ht 68.0 in | Wt 190.4 lb

## 2011-12-18 DIAGNOSIS — C8588 Other specified types of non-Hodgkin lymphoma, lymph nodes of multiple sites: Secondary | ICD-10-CM

## 2011-12-18 DIAGNOSIS — N189 Chronic kidney disease, unspecified: Secondary | ICD-10-CM

## 2011-12-18 DIAGNOSIS — C8291 Follicular lymphoma, unspecified, lymph nodes of head, face, and neck: Secondary | ICD-10-CM

## 2011-12-18 DIAGNOSIS — D7589 Other specified diseases of blood and blood-forming organs: Secondary | ICD-10-CM

## 2011-12-18 LAB — COMPREHENSIVE METABOLIC PANEL
ALT: <8 U/L (ref 0–53)
AST: 16 U/L (ref 0–37)
Albumin: 4.1 g/dL (ref 3.5–5.2)
CO2: 33 mEq/L — ABNORMAL HIGH (ref 19–32)
Calcium: 9.6 mg/dL (ref 8.4–10.5)
Chloride: 98 mEq/L (ref 96–112)
Creatinine, Ser: 1.71 mg/dL — ABNORMAL HIGH (ref 0.50–1.35)
Potassium: 4.5 mEq/L (ref 3.5–5.3)

## 2011-12-18 LAB — CBC WITH DIFFERENTIAL/PLATELET
BASO%: 0.2 % (ref 0.0–2.0)
Basophils Absolute: 0 10*3/uL (ref 0.0–0.1)
EOS%: 1.5 % (ref 0.0–7.0)
MCH: 34.6 pg — ABNORMAL HIGH (ref 27.2–33.4)
MCHC: 33.6 g/dL (ref 32.0–36.0)
MCV: 102.8 fL — ABNORMAL HIGH (ref 79.3–98.0)
MONO#: 0.6 10*3/uL (ref 0.1–0.9)
NEUT#: 3.5 10*3/uL (ref 1.5–6.5)
NEUT%: 63.8 % (ref 39.0–75.0)
Platelets: 165 10*3/uL (ref 140–400)
RDW: 13.9 % (ref 11.0–14.6)
lymph#: 1.3 10*3/uL (ref 0.9–3.3)

## 2011-12-18 NOTE — Progress Notes (Signed)
Hematology and Oncology Follow Up Visit  [name] [id number] 07-20-1919 78 y.o. 12/18/2011 9:51 AM    Principle Diagnosis:  A 78 year old gentleman with low grade lymphoproliferative disorder, possibly follicular lymphoma, found a nodular structure in the mouth found in June of 2009 status post removal without any evidence of recurrent disease.  No evidence of any systemic disease despite investigation with imaging studies and followup. Last scans done in 01/2010.    Interim History: [name] presents today for a followup visit.  He is a pleasant 78 year old gentleman with questionable lymphoproliferative disorder that despite the workup and the followup over 4 years including a PET imaging in July of 2009, repeat CT scan in June of 2011, it really failed to show any evidence to suggest a lymphoproliferative disorder systemically.  [name] is doing good at this time.  He is not reporting any constitutional symptoms, not reporting any illnesses, had not reported any hospitalization or discomfort.  He is performing activities of daily living without any hindrance or decline.  He lives with his wife in an apartment in senior living at this time.  Medications: I have reviewed the patient's current medications. Current outpatient prescriptions:acetaminophen (TYLENOL) 500 MG tablet, Take 500 mg by mouth every 6 (six) hours as needed., Disp: , Rfl: ;  allopurinol (ZYLOPRIM) 300 MG tablet, Take 300 mg by mouth daily., Disp: , Rfl: ;  amLODipine (NORVASC) 5 MG tablet, Take 2.5 mg by mouth daily.  , Disp: , Rfl: ;  aspirin 81 MG tablet, Take 81 mg by mouth daily.  , Disp: , Rfl:  Cholecalciferol (VITAMIN D3) 1000 UNITS CAPS, Take 1 capsule by mouth daily.  , Disp: , Rfl: ;  colchicine 0.6 MG tablet, Take 0.6 mg by mouth as needed., Disp: , Rfl: ;  hydrochlorothiazide (HYDRODIURIL) 25 MG tablet, Take 12.5 mg by mouth every other day.  , Disp: , Rfl: ;  metoprolol succinate (TOPROL XL) 25 MG 24 hr tablet, 1/2  tablet po daily , Disp: 45 tablet, Rfl: 0 Multiple Vitamins-Minerals (ICAPS AREDS FORMULA PO), Take 1 capsule by mouth 2 (two) times daily.  , Disp: , Rfl: ;  nitroGLYCERIN (NITROSTAT) 0.4 MG SL tablet, Place 1 tablet (0.4 mg total) under the tongue every 5 (five) minutes as needed., Disp: 25 tablet, Rfl: 6;  omeprazole (PRILOSEC) 20 MG capsule, Take 20 mg by mouth daily.  , Disp: , Rfl: ;  ranitidine (ZANTAC) 150 MG tablet, Take 150 mg by mouth 2 (two) times daily.  , Disp: , Rfl:  terazosin (HYTRIN) 5 MG capsule, Take 5 mg by mouth at bedtime.  , Disp: , Rfl: ;  fluticasone (FLONASE) 50 MCG/ACT nasal spray, Place 2 sprays into the nose daily., Disp: , Rfl: ;  NON FORMULARY, 1 day or 1 dose. Calbetasol cream apply once daily, Disp: , Rfl: ;  NON FORMULARY, Place 0.01 % into both ears daily. 3 drops to each ear, Disp: , Rfl:   Allergies:  Allergies  Allergen Reactions  . Penicillins     Past Medical History, Surgical history, Social history, and Family History were reviewed and updated.  Review of Systems: Constitutional:  Negative for fever, chills, night sweats, anorexia, weight loss, pain. Cardiovascular: no chest pain or dyspnea on exertion Respiratory: no cough, shortness of breath, or wheezing Neurological: negative Dermatological: negative ENT: negative Skin: Negative. Gastrointestinal: no abdominal pain, change in bowel habits, or black or bloody stools Genito-Urinary: negative Hematological and Lymphatic: negative Breast: negative Musculoskeletal: negative Remaining  ROS negative. Physical Exam: Blood pressure 148/81, pulse 69, temperature 97.2 F (36.2 C), temperature source Oral, height 5\' 8"  (1.727 m), weight 190 lb 6.4 oz (86.365 kg). ECOG: 1 General appearance: alert Head: Normocephalic, without obvious abnormality, atraumatic Neck: no adenopathy, no carotid bruit, no JVD, supple, symmetrical, trachea midline and thyroid not enlarged, symmetric, no  tenderness/mass/nodules Lymph nodes: Cervical, supraclavicular, and axillary nodes normal. Heart:regular rate and rhythm, S1, S2 normal, no murmur, click, rub or gallop Lung:chest clear, no wheezing, rales, normal symmetric air entry Abdomin: soft, non-tender, without masses or organomegaly EXT:no erythema, induration, or nodules   Lab Results: Lab Results  Component Value Date   WBC 5.5 12/18/2011   HGB 14.9 12/18/2011   HCT 44.3 12/18/2011   MCV 102.8* 12/18/2011   PLT 165 12/18/2011     Chemistry      Component Value Date/Time   NA 141 05/20/2011 1351   K 4.6 05/20/2011 1351   CL 101 05/20/2011 1351   CO2 32 05/20/2011 1351   BUN 29* 05/20/2011 1351   CREATININE 1.79* 05/20/2011 1351      Component Value Date/Time   CALCIUM 9.3 05/20/2011 1351   ALKPHOS 82 05/20/2011 1351   AST 17 05/20/2011 1351   ALT 10 05/20/2011 1351   BILITOT 0.5 05/20/2011 1351       Impression and Plan: A 78 year old gentleman with the following issues: 1. 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  He continues to have no evidence to suggest recurrent disease at this time clinically or by physical examination or imaging studies.  Plan is for him to follow with as needed. He is already seeing his PCP, cardiology and renal medicine and he wishes to cut down on doctors visits. I told him that him that I will be happy to see him in the future as needed.  2. Chronic renal insufficiency followed by Dr. Arrie Aran. 3. Weight loss that has resolved at this time.  His weight has been stable.  As a matter of fact he gained weight. 4. Macrocytosis, unclear etiology at this time.  This could be medication related. If he develops Anemia, then will work it up further.    [facility], MD 5/10/20139:51 AM

## 2012-02-24 ENCOUNTER — Encounter: Payer: Self-pay | Admitting: Cardiology

## 2012-03-15 IMAGING — CT CT HEAD W/O CM
1 of 2 series · 13 of 30 positions shown, 17 images · non-contrast
Comparison: Neck CT 02/05/2010.

CLINICAL DATA: Dizziness with nausea and vomiting for 1 day.

CT HEAD WITHOUT CONTRAST
TECHNIQUE: Contiguous axial images were obtained from the base of
the skull through the vertex without contrast.

[Series 2: brain · axial · 0.49mm/px · z∈[+99,+241]mm · 13 of 32 slices shown, 17 images]
[im 3/32  brain]
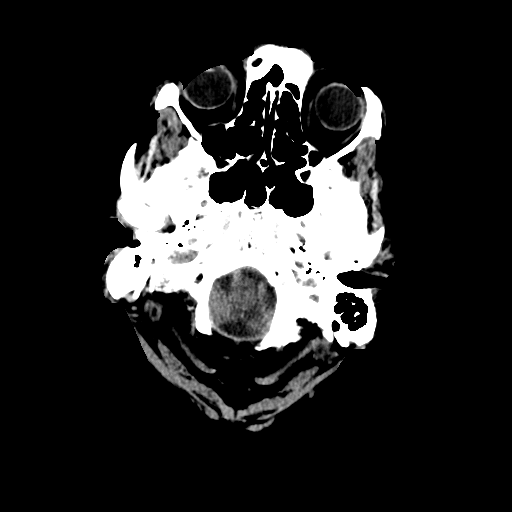
[im 3/32  bone]
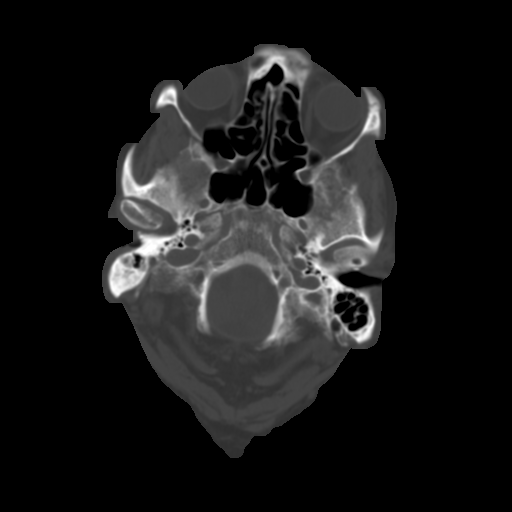
[im 5/32  brain]
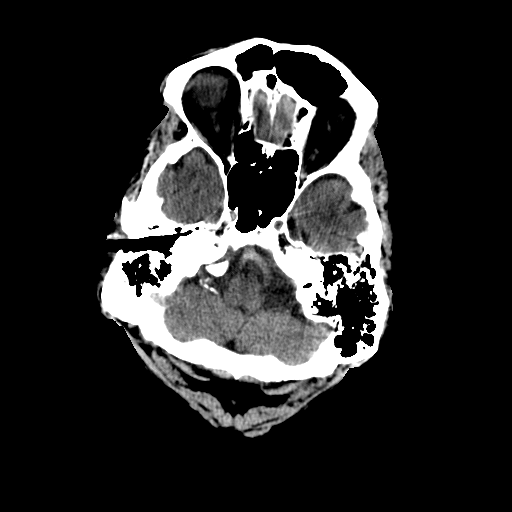
[im 7/32  brain]
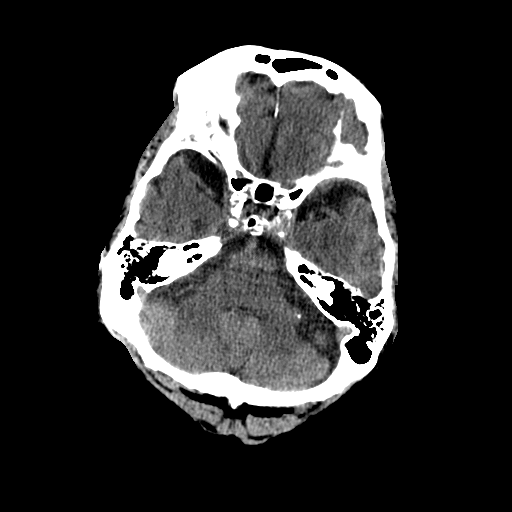
[im 9/32  brain]
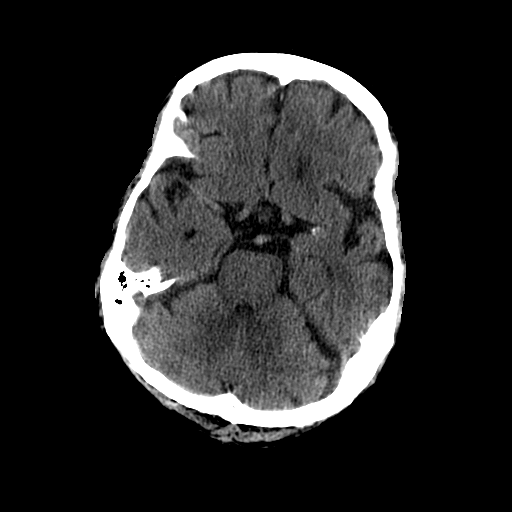
[im 12/32  brain]
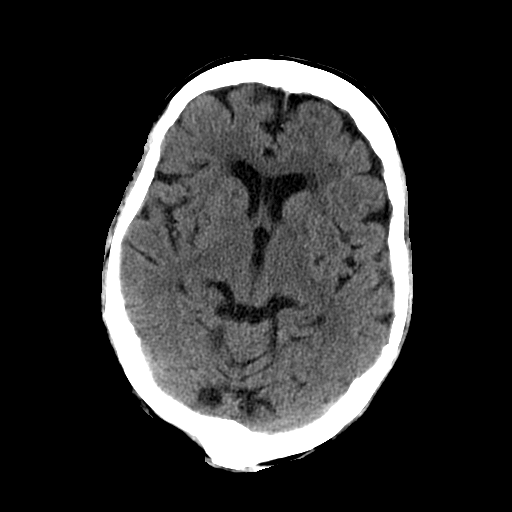
[im 12/32  bone]
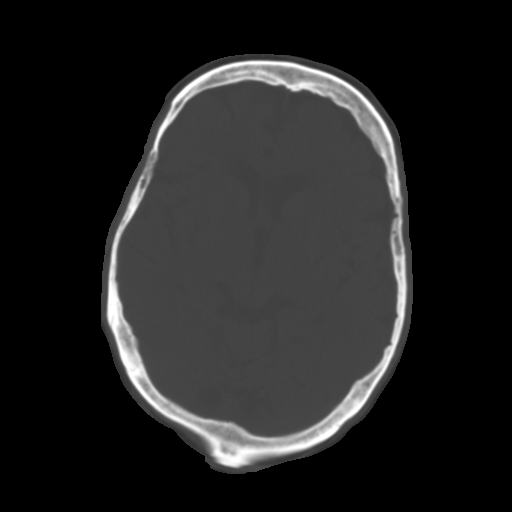
[im 14/32  brain]
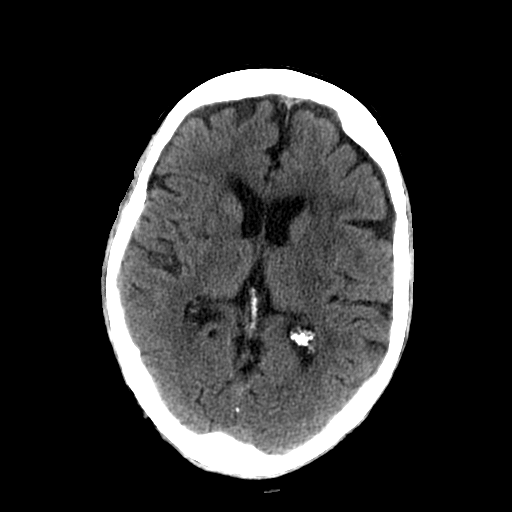
[im 16/32  brain]
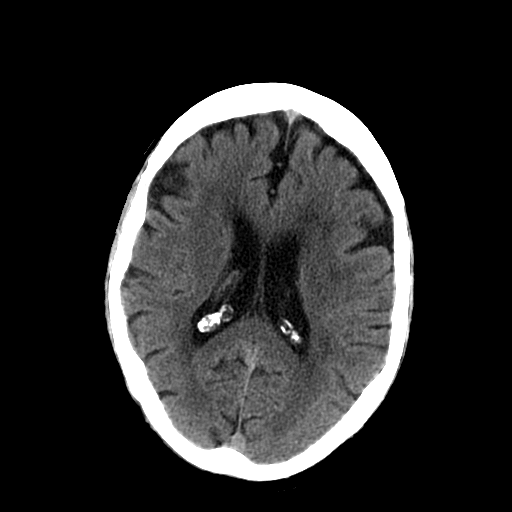
[im 18/32  brain]
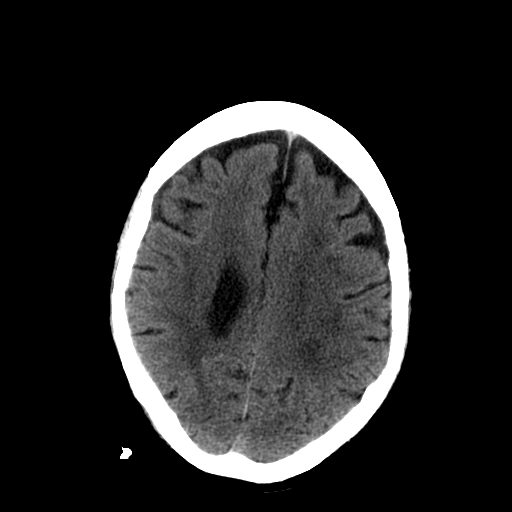
[im 20/32  brain]
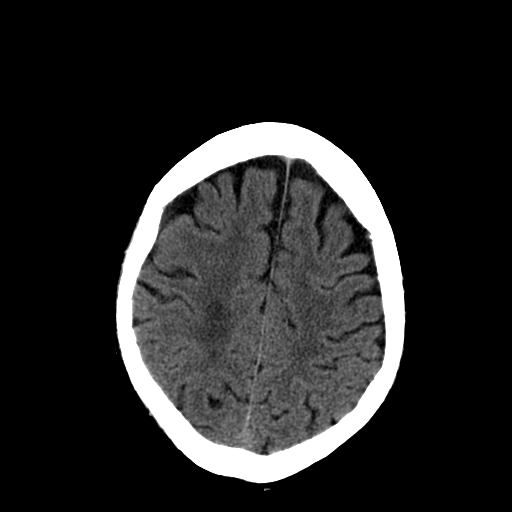
[im 20/32  bone]
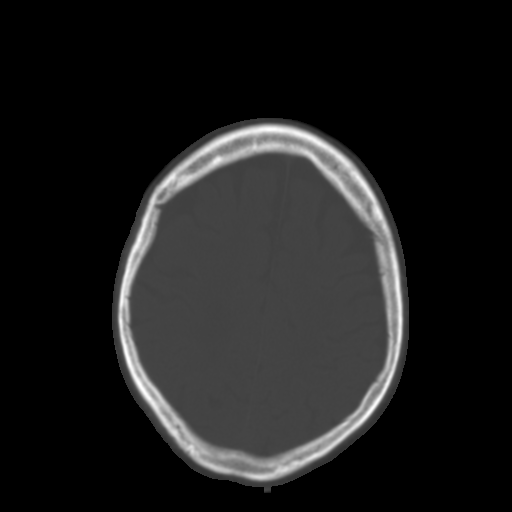
[im 23/32  brain]
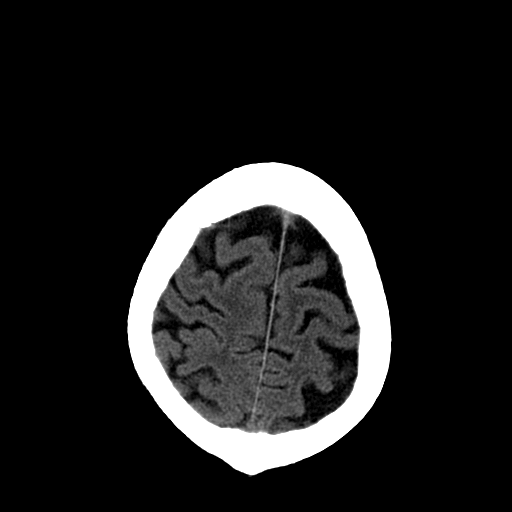
[im 25/32  brain]
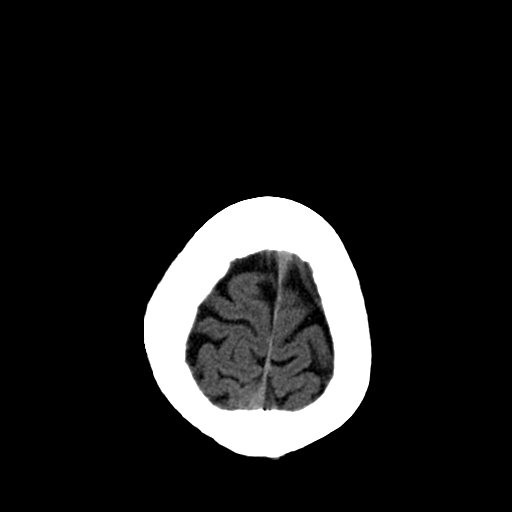
[im 27/32  brain]
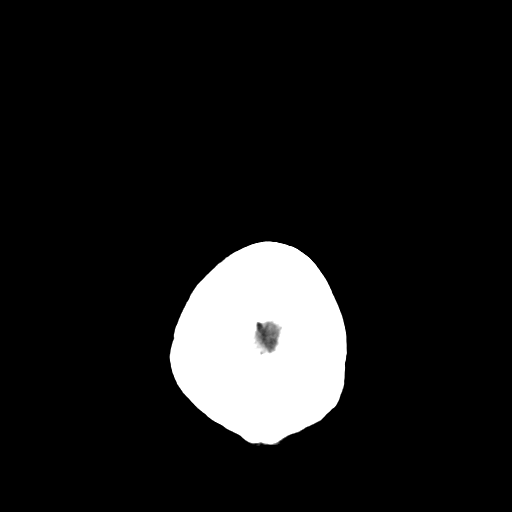
[im 29/32  brain]
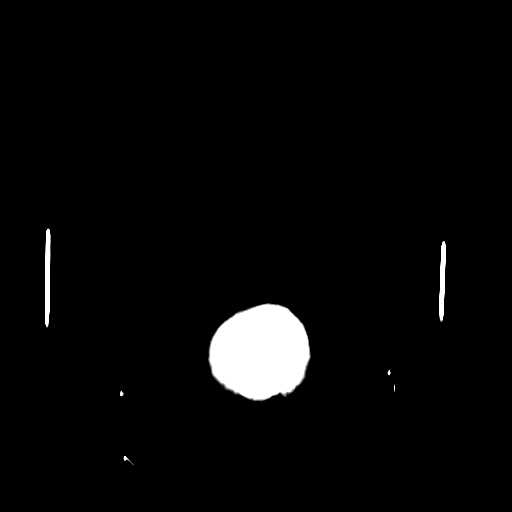
[im 29/32  bone]
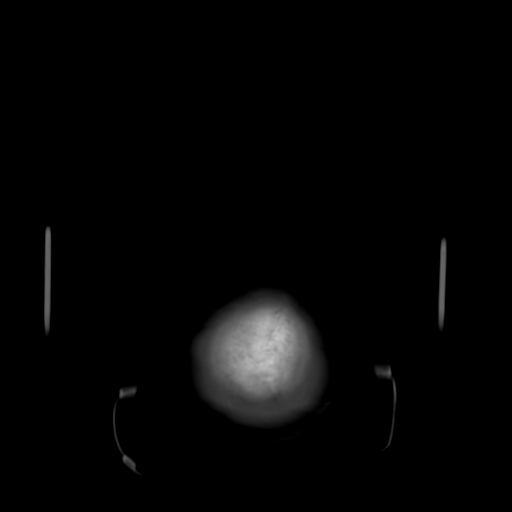

[13 of 30 positions shown; findings below may reference images not displayed]

FINDINGS: No intracranial hemorrhage.  Moderate small vessel
disease type changes. No CT evidence of large acute infarct.  Small
acute infarct cannot be excluded by CT.  No intracranial mass
detected on this unenhanced exam.  Vascular calcifications.  No
hydrocephalus.
IMPRESSION: Small vessel disease type changes.

No intracranial hemorrhage or CT evidence of large acute infarct.

Vascular calcifications.

## 2012-04-08 ENCOUNTER — Ambulatory Visit (INDEPENDENT_AMBULATORY_CARE_PROVIDER_SITE_OTHER): Payer: Medicare Other | Admitting: Internal Medicine

## 2012-04-08 DIAGNOSIS — R0602 Shortness of breath: Secondary | ICD-10-CM

## 2012-04-08 LAB — PULMONARY FUNCTION TEST

## 2012-04-08 NOTE — Progress Notes (Signed)
PFT done today. 

## 2012-07-15 ENCOUNTER — Other Ambulatory Visit: Payer: Self-pay | Admitting: Cardiology

## 2012-08-23 ENCOUNTER — Institutional Professional Consult (permissible substitution): Payer: Medicare Other | Admitting: Pulmonary Disease

## 2012-08-24 ENCOUNTER — Encounter: Payer: Self-pay | Admitting: Internal Medicine

## 2012-08-25 ENCOUNTER — Ambulatory Visit (INDEPENDENT_AMBULATORY_CARE_PROVIDER_SITE_OTHER): Payer: Medicare Other | Admitting: Internal Medicine

## 2012-08-25 ENCOUNTER — Encounter: Payer: Self-pay | Admitting: Internal Medicine

## 2012-08-25 VITALS — BP 124/80 | HR 95 | Temp 98.1°F | Ht 67.5 in | Wt 195.6 lb

## 2012-08-25 DIAGNOSIS — R0989 Other specified symptoms and signs involving the circulatory and respiratory systems: Secondary | ICD-10-CM

## 2012-08-25 DIAGNOSIS — R0609 Other forms of dyspnea: Secondary | ICD-10-CM

## 2012-08-25 DIAGNOSIS — R061 Stridor: Secondary | ICD-10-CM | POA: Insufficient documentation

## 2012-08-25 DIAGNOSIS — R0600 Dyspnea, unspecified: Secondary | ICD-10-CM

## 2012-08-25 DIAGNOSIS — J449 Chronic obstructive pulmonary disease, unspecified: Secondary | ICD-10-CM

## 2012-08-25 NOTE — Progress Notes (Addendum)
  Subjective:    Patient ID: [name], male    DOB: 1919/04/18  MRN: [id number]  HPI  81 yowm quit smoking in 1983 s/p LLLobe bx by Edwyna Shell and recovered completely referred to pulmonary clinic 08/25/2012 by Dr Clovis Riley with pnds x around 2000 assoc with sob since 06/2012  08/25/2012 1st pulmonary ov/ Kymari Lollis cc persistent year round pnds x over 10 years now sensation of something stuck in his throat x around 06/2012 prednisone makes it better, on prilosec at hs and zantac q am, no purulent sputum. Not better with albuterol.   No obvious daytime variabilty or   cp or chest tightness, subjective wheeze overt  hb symptoms. No unusual exp hx or h/o childhood pna/ asthma or premature birth to his knowledge.   Symptoms worse supine but eventually sleeping ok without nocturnal  or early am exacerbation  of respiratory c/o's or need for noct saba. Also denies any obvious fluctuation of symptoms with weather or environmental changes or other aggravating or alleviating factors except as outlined above    .  Review of Systems  Constitutional: Negative for fever, chills, activity change, appetite change and unexpected weight change.  HENT: Positive for congestion. Negative for sore throat, rhinorrhea, sneezing, trouble swallowing, dental problem, voice change and postnasal drip.   Eyes: Negative for visual disturbance.  Respiratory: Positive for cough and shortness of breath. Negative for choking.   Cardiovascular: Negative for chest pain and leg swelling.  Gastrointestinal: Negative for nausea, vomiting and abdominal pain.  Genitourinary: Negative for difficulty urinating.  Musculoskeletal: Positive for arthralgias.  Skin: Positive for rash.  Psychiatric/Behavioral: Negative for behavioral problems and confusion.       Objective:   Physical Exam  amb wm nad with mild insp stridor Wt Readings from Last 3 Encounters:  08/25/12 195 lb 9.6 oz (88.724 kg)  12/18/11 190 lb 6.4 oz (86.365 kg)    10/15/11 190 lb (86.183 kg)   HEENT mild turbinate edema.  Oropharynx no thrush or excess pnd or cobblestoning.  No JVD or cervical adenopathy. Mild accessory muscle hypertrophy. Trachea midline, nl thryroid. Chest was hyperinflated by percussion with diminished breath sounds and moderate increased exp time without wheeze. Hoover sign positive at mid inspiration. Regular rate and rhythm without murmur gallop or rub or increase P2 or edema.  Abd: no hsm, nl excursion. Ext warm without cyanosis or clubbing.   CXR  08/25/2012 :   Enlargement of cardiac silhouette. 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 No acute abnormalities.      Assessment & Plan:

## 2012-08-25 NOTE — Assessment & Plan Note (Signed)
No definite inspiratory plateau by pft's 04/08/12 though this was before he really worsened. No need to repeat pending ent re-eval

## 2012-08-25 NOTE — Assessment & Plan Note (Addendum)
-   PFT's 04/08/12 FEV1 1.63 (77%) ratio 69 DLCO 75%   When respiratory symptoms begin or become refractory well after a patient reports complete smoking cessation,  Especially when this wasn't the case while they were smoking, a red flag is raised based on the work of Dr Primitivo Gauze which states:  if you quit smoking when your best day FEV1 is still well preserved( as is the case here at 77% predicted years p he quit)  it is highly unlikely you will progress to severe disease.  That is to say, once the smoking stops,  the symptoms should not suddenly erupt or markedly worsen.  If so, the differential diagnosis should include  obesity/deconditioning,  LPR/Reflux/Aspiration syndromes,  occult CHF, or  especially side effect of medications commonly used in this population.  He is not on acei but this could be gerd related > max rx  And arrange ENT eval / f/u

## 2012-08-25 NOTE — Patient Instructions (Addendum)
Omeprazole 40 mg Take 30-60 min before first meal of the day and Zantac(rantidine) 300mg  at bedtime for now  GERD (REFLUX)  is an extremely common cause of respiratory symptoms like yours, many times with no significant heartburn at all.    It can be treated with medication, but also with lifestyle changes including avoidance of late meals, excessive alcohol, smoking cessation, and avoid fatty foods, chocolate, peppermint, colas, red wine, and acidic juices such as orange juice.  NO MINT OR MENTHOL PRODUCTS SO NO COUGH DROPS  USE SUGARLESS CANDY INSTEAD (jolley ranchers or Stover's)  NO OIL BASED VITAMINS - use powdered substitutes.  Please see patient coordinator before you leave today  to schedule ENT evaluation w/in next 2 weeks - see [place] back after this if not satisfied with your breathing

## 2012-08-26 ENCOUNTER — Ambulatory Visit (INDEPENDENT_AMBULATORY_CARE_PROVIDER_SITE_OTHER)
Admission: RE | Admit: 2012-08-26 | Discharge: 2012-08-26 | Disposition: A | Discharge status: Home / Self Care | Payer: Medicare Other | Source: Ambulatory Visit | Attending: Internal Medicine | Admitting: Internal Medicine

## 2012-08-26 DIAGNOSIS — R0989 Other specified symptoms and signs involving the circulatory and respiratory systems: Secondary | ICD-10-CM

## 2012-08-26 DIAGNOSIS — R0600 Dyspnea, unspecified: Secondary | ICD-10-CM

## 2012-08-30 ENCOUNTER — Telehealth: Payer: Self-pay | Admitting: Internal Medicine

## 2012-08-30 NOTE — Telephone Encounter (Signed)
Spoke with receptionist at Dr. Butch Penny office made her aware Dr.[name] in out of office for the day.  Receptionist then spoke with Dr. Ezzard Standing who stated that Dr. Sherene Sires could just give him a call upon his return to office.  Nothing further needed, will forward to Dr. Sherene Sires as Lorain Childes

## 2012-11-03 ENCOUNTER — Other Ambulatory Visit: Payer: Self-pay | Admitting: Dermatology

## 2012-11-26 ENCOUNTER — Encounter (HOSPITAL_COMMUNITY): Payer: Self-pay | Admitting: *Deleted

## 2012-11-26 ENCOUNTER — Emergency Department (HOSPITAL_COMMUNITY)
Admission: EM | Admit: 2012-11-26 | Discharge: 2012-11-26 | Disposition: A | Discharge status: Home / Self Care | Payer: Medicare Other | Attending: Emergency Medicine | Admitting: Emergency Medicine

## 2012-11-26 DIAGNOSIS — Z87891 Personal history of nicotine dependence: Secondary | ICD-10-CM | POA: Insufficient documentation

## 2012-11-26 DIAGNOSIS — Z7982 Long term (current) use of aspirin: Secondary | ICD-10-CM | POA: Insufficient documentation

## 2012-11-26 DIAGNOSIS — M109 Gout, unspecified: Secondary | ICD-10-CM | POA: Insufficient documentation

## 2012-11-26 DIAGNOSIS — Z8669 Personal history of other diseases of the nervous system and sense organs: Secondary | ICD-10-CM | POA: Insufficient documentation

## 2012-11-26 DIAGNOSIS — I10 Essential (primary) hypertension: Secondary | ICD-10-CM | POA: Insufficient documentation

## 2012-11-26 DIAGNOSIS — Z8679 Personal history of other diseases of the circulatory system: Secondary | ICD-10-CM | POA: Insufficient documentation

## 2012-11-26 DIAGNOSIS — R0982 Postnasal drip: Secondary | ICD-10-CM | POA: Insufficient documentation

## 2012-11-26 DIAGNOSIS — R040 Epistaxis: Secondary | ICD-10-CM

## 2012-11-26 DIAGNOSIS — IMO0002 Reserved for concepts with insufficient information to code with codable children: Secondary | ICD-10-CM | POA: Insufficient documentation

## 2012-11-26 DIAGNOSIS — K219 Gastro-esophageal reflux disease without esophagitis: Secondary | ICD-10-CM | POA: Insufficient documentation

## 2012-11-26 DIAGNOSIS — Z79899 Other long term (current) drug therapy: Secondary | ICD-10-CM | POA: Insufficient documentation

## 2012-11-26 LAB — CBC WITH DIFFERENTIAL/PLATELET
Basophils Relative: 1 % (ref 0–1)
Eosinophils Absolute: 0 10*3/uL (ref 0.0–0.7)
Eosinophils Relative: 1 % (ref 0–5)
Hemoglobin: 13.5 g/dL (ref 13.0–17.0)
Lymphs Abs: 1.2 10*3/uL (ref 0.7–4.0)
MCH: 34.3 pg — ABNORMAL HIGH (ref 26.0–34.0)
MCHC: 33.2 g/dL (ref 30.0–36.0)
MCV: 103.3 fL — ABNORMAL HIGH (ref 78.0–100.0)
Monocytes Relative: 8 % (ref 3–12)
RBC: 3.94 MIL/uL — ABNORMAL LOW (ref 4.22–5.81)

## 2012-11-26 LAB — BASIC METABOLIC PANEL
BUN: 23 mg/dL (ref 6–23)
CO2: 31 mEq/L (ref 19–32)
Calcium: 9.1 mg/dL (ref 8.4–10.5)
Glucose, Bld: 115 mg/dL — ABNORMAL HIGH (ref 70–99)
Potassium: 4.1 mEq/L (ref 3.5–5.1)
Sodium: 139 mEq/L (ref 135–145)

## 2012-11-26 MED ORDER — SULFAMETHOXAZOLE-TRIMETHOPRIM 800-160 MG PO TABS: 1.0000 | ORAL_TABLET | Freq: Two times a day (BID) | ORAL | Status: DC

## 2012-11-26 MED ORDER — SODIUM CHLORIDE 0.9 % IV BOLUS (SEPSIS)
1000.0000 mL | Freq: Once | INTRAVENOUS | Status: AC
  Administered 2012-11-26: 1000 mL via INTRAVENOUS

## 2012-11-26 MED ORDER — OXYMETAZOLINE HCL 0.05 % NA SOLN
1.0000 | Freq: Once | NASAL | Status: AC
  Administered 2012-11-26: 1 via NASAL
  Filled 2012-11-26: qty 15

## 2012-11-26 MED ORDER — PANTOPRAZOLE SODIUM 40 MG IV SOLR
40.0000 mg | Freq: Once | INTRAVENOUS | Status: AC
  Administered 2012-11-26: 40 mg via INTRAVENOUS
  Filled 2012-11-26: qty 40

## 2012-11-26 MED ORDER — ONDANSETRON HCL 4 MG/2ML IJ SOLN
4.0000 mg | Freq: Once | INTRAMUSCULAR | Status: AC
  Administered 2012-11-26: 4 mg via INTRAVENOUS
  Filled 2012-11-26: qty 2

## 2012-11-26 MED ORDER — SULFAMETHOXAZOLE-TMP DS 800-160 MG PO TABS
1.0000 | ORAL_TABLET | Freq: Once | ORAL | Status: AC
  Administered 2012-11-26: 1 via ORAL
  Filled 2012-11-26: qty 1

## 2012-11-26 MED ORDER — BACITRACIN ZINC 500 UNIT/GM EX OINT
1.0000 "application " | TOPICAL_OINTMENT | CUTANEOUS | Status: AC
  Administered 2012-11-26: 2 via TOPICAL

## 2012-11-26 NOTE — ED Notes (Signed)
Pt actively bleeding after insertion of rhino rocket. MD notified

## 2012-11-26 NOTE — ED Notes (Signed)
Per ems report Pt got up to go to the restroom, coughed, and felt his nose running, it was blood. Ice pack was given. 2 spray afrin enroute. Pt takes ASA

## 2012-11-26 NOTE — ED Provider Notes (Signed)
Pt reassessed. Unable to place 10cm Merocel fully by previous provider. Possible because of difficult anatomy from previous nasal fx? Several cm were exposed and hanging near pt's mouth. Removed and new merocel trimmed to ~7cm and placed. Not ideal but adequate hemostasis achieved. Labs unremarkable. HD stable. Pt will be started on prophylactic abx. ENT follow-up. Return precautions discussed.   Raeford Razor, MD 11/26/12 1007

## 2012-11-26 NOTE — ED Provider Notes (Signed)
History     CSN: [id number]  Arrival date & time 11/26/12  0456   First MD Initiated Contact with Patient 11/26/12 [phone]      Chief Complaint  Patient presents with  . Epistaxis    (Consider location/radiation/quality/duration/timing/severity/associated sxs/prior treatment) Patient is a 79 y.o. male presenting with nosebleeds. The history is provided by the patient and medical records. No language interpreter was used.  Epistaxis  This is a new problem. The current episode started 3 to 5 hours ago. The problem occurs constantly. The problem has not changed since onset.The problem is associated with aspirin. The bleeding has been from both nares. He has tried applying pressure, ice and vasoconstrictors for the symptoms. The treatment provided mild relief. His past medical history is significant for sinus problems and allergies. His past medical history does not include bleeding disorder, colds, nose-picking or frequent nosebleeds.    [name] is a 79 y.o. male  with a hx of disease, vertigo, lymphoma, GERD, gallop presents to the Emergency Department complaining of gradual, persistent, progressively worsening nosebleed onset 3 AM after he got up to go the bathroom. Patient states while at the facility he tried to hold his nose, the used ice and use Afrin without being able to get it stopped. At that time they called EMS and had him transported here to the emergency department.. Associated symptoms include treatment of postnasal drip for which she uses a nasal steroid.  Nothing makes it better and blowing his nose makes it worse.  Pt denies fever, chills, headache, neck pain, chest pain, SOB, abdominal pain, N/V/D, weakness, syncope.    Past Medical History  Diagnosis Date  . Heart disease     PCI/Stent RCA 2000  . Vertigo   . Lymphoma   . Hypertension   . GERD (gastroesophageal reflux disease)   . Gout   . RAS (renal artery stenosis)     PCI left renal artery 2005    Past  Surgical History  Procedure Laterality Date  . Stent to the righ coronary in 2000    . Lung surgery    . Coronary angioplasty with stent placement    . Appendectomy    . Tonsillectomy    . Hemorroidectomy    . Cataract extraction      Family History  Problem Relation Age of Onset  . Lung cancer Brother     heavy smoker  . Heart disease Father   . Rheum arthritis Mother     History  Substance Use Topics  . Smoking status: Former Smoker -- 0.50 packs/day for 50 years    Types: Cigarettes    Quit date: 05/03/1981  . Smokeless tobacco: Never Used     Comment: "did not inhale"  . Alcohol Use: No      Review of Systems  Constitutional: Negative for fever, diaphoresis, appetite change, fatigue and unexpected weight change.  HENT: Positive for nosebleeds. Negative for mouth sores and neck stiffness.   Eyes: Negative for visual disturbance.  Respiratory: Negative for cough, chest tightness, shortness of breath and wheezing.   Cardiovascular: Negative for chest pain.  Gastrointestinal: Negative for nausea, vomiting, abdominal pain, diarrhea and constipation.  Skin: Negative for rash.  Allergic/Immunologic: Negative for immunocompromised state.  Neurological: Negative for syncope, light-headedness and headaches.  Hematological: Does not bruise/bleed easily.  Psychiatric/Behavioral: Negative for sleep disturbance. The patient is not nervous/anxious.     Allergies  Penicillins  Home Medications   Current Outpatient Rx  Name  Route  Sig  Dispense  Refill  . acetaminophen (TYLENOL) 500 MG tablet   Oral   Take 500 mg by mouth every 6 (six) hours as needed for pain.          Marland Kitchen allopurinol (ZYLOPRIM) 300 MG tablet   Oral   Take 300 mg by mouth every morning.          Marland Kitchen amLODipine (NORVASC) 5 MG tablet   Oral   Take 2.5 mg by mouth every morning.          Marland Kitchen aspirin 81 MG tablet   Oral   Take 81 mg by mouth every morning.          . cholecalciferol (VITAMIN D)  1000 UNITS tablet   Oral   Take 1,000 Units by mouth every morning.         . clobetasol cream (TEMOVATE) 0.05 %   Topical   Apply 1 application topically daily.         . fluticasone (FLONASE) 50 MCG/ACT nasal spray   Nasal   Place 2 sprays into the nose at bedtime.          . hydrochlorothiazide (HYDRODIURIL) 25 MG tablet   Oral   Take 12.5 mg by mouth every other day.           . metoprolol succinate (TOPROL-XL) 25 MG 24 hr tablet   Oral   Take 12.5 mg by mouth every morning.         . Multiple Vitamins-Minerals (ICAPS AREDS FORMULA PO)   Oral   Take 1 capsule by mouth 2 (two) times daily.           . nitroGLYCERIN (NITROSTAT) 0.4 MG SL tablet   Sublingual   Place 0.4 mg under the tongue every 5 (five) minutes as needed for chest pain.         Marland Kitchen omeprazole (PRILOSEC) 20 MG capsule   Oral   Take 20 mg by mouth 2 (two) times daily.          . ranitidine (ZANTAC) 150 MG tablet   Oral   Take 150 mg by mouth 2 (two) times daily.           Marland Kitchen terazosin (HYTRIN) 5 MG capsule   Oral   Take 5 mg by mouth at bedtime.             BP 165/85  Pulse 81  Temp(Src) 98.2 F (36.8 C) (Axillary)  Resp 20  SpO2 96%  Physical Exam  Nursing note and vitals reviewed. Constitutional: He is oriented to person, place, and time. He appears well-developed and well-nourished. No distress.  HENT:  Head: Normocephalic and atraumatic.  Right Ear: Tympanic membrane, external ear and ear canal normal.  Left Ear: Tympanic membrane, external ear and ear canal normal.  Nose: Septal deviation present. Epistaxis is observed. Right sinus exhibits no maxillary sinus tenderness and no frontal sinus tenderness. Left sinus exhibits no maxillary sinus tenderness and no frontal sinus tenderness.  Mouth/Throat: Uvula is midline and oropharynx is clear and moist. No edematous. No oropharyngeal exudate, posterior oropharyngeal edema, posterior oropharyngeal erythema or tonsillar  abscesses.  Pt with deviated septum, narrowing the Left nare Exam with nasal speculum shows bleeding from the L nare, but unable to visualize the source of the blood 2/2 deviated septum  Eyes: Conjunctivae are normal. No scleral icterus.  Neck: Normal range of motion. Neck supple.  Cardiovascular: Normal rate, regular rhythm, normal heart  sounds and intact distal pulses.   Pulmonary/Chest: Effort normal and breath sounds normal. No respiratory distress. He has no wheezes.  Abdominal: Soft. Bowel sounds are normal. He exhibits no distension. There is no tenderness.  Musculoskeletal: Normal range of motion. He exhibits no edema.  Neurological: He is alert and oriented to person, place, and time. He exhibits normal muscle tone. Coordination normal.  Speech is clear and goal oriented Moves extremities without ataxia  Skin: Skin is warm and dry. No rash noted. He is not diaphoretic. No erythema.  Psychiatric: He has a normal mood and affect.    ED Course  Procedures (including critical care time)  Labs Reviewed - No data to display No results found.   1. Epistaxis       MDM  [name] presents with persistent epistaxis.  Pt with Hx of nasal steroid use and concern for ulcerated septum is present. Source of blood is not visible on exam.  Rhino rocket placed with some improvement in the post nasal drip.  Alert, oriented, NAD, nontoxic, nonseptic appearing.    Dr. Donnetta Hutching was consulted, evaluated this patient with me and agrees with the plan.  He will observe in the department and dispo.    0700:   Mercel packing placed in left nostril.   Packing would only advance approximately 50% of its length secondary to obstruction.  IV fluids administered. IV Protonix. IV Zofran. Discussed with Dr. Juleen China.  He will resume care.  Medical screening examination/treatment/procedure(s) were conducted as a shared visit with non-physician practitioner(s) and myself.  I personally evaluated the  patient during the encounter   Dierdre Forth, PA-C 11/26/12 2130  Donnetta Hutching, MD 11/26/12 [phone]

## 2012-11-26 NOTE — ED Notes (Signed)
Bed:WA21<BR> Expected date:<BR> Expected time:<BR> Means of arrival:<BR> Comments:<BR> EMS

## 2012-11-26 NOTE — ED Notes (Signed)
New rhino rocket placed in left nare

## 2012-11-26 NOTE — ED Notes (Signed)
Dr. Adriana Simas at bedside for reevaluation, pt continuing to have bleeding and Rhino Rocket has fallen out.

## 2012-12-16 ENCOUNTER — Ambulatory Visit (INDEPENDENT_AMBULATORY_CARE_PROVIDER_SITE_OTHER): Payer: Medicare Other | Admitting: Cardiology

## 2012-12-16 ENCOUNTER — Encounter: Payer: Self-pay | Admitting: Cardiology

## 2012-12-16 VITALS — BP 129/75 | HR 81 | Ht 68.0 in | Wt 194.2 lb

## 2012-12-16 DIAGNOSIS — I10 Essential (primary) hypertension: Secondary | ICD-10-CM

## 2012-12-16 DIAGNOSIS — I519 Heart disease, unspecified: Secondary | ICD-10-CM

## 2012-12-16 DIAGNOSIS — R609 Edema, unspecified: Secondary | ICD-10-CM

## 2012-12-16 MED ORDER — GABAPENTIN 300 MG PO CAPS: 300.0000 mg | ORAL_CAPSULE | Freq: Every day | ORAL | Status: DC

## 2012-12-16 NOTE — Patient Instructions (Addendum)
Please start Neurontin 300 mg a day Continue all other medications as listed.  Please wear compression stockings daily.  Follow up in 1 year with Dr Antoine Poche.  You will receive a letter in the mail 2 months before you are due.  Please call us when you receive this letter to schedule your follow up appointment.

## 2012-12-16 NOTE — Progress Notes (Signed)
HPI The patient presents for follow up of CAD.  Since I last saw him he has done well except for progressive burning pain in his feet. This is present every night. It takes several hours for to go away when he finally gets to relax with his feet. When I last saw him he was having some shortness of breath at night. However, this seemed to be related to sinus drainage and he is having this managed I.  The patient denies any new symptoms such as chest discomfort, neck or arm discomfort. There has been no new shortness of breath, PND or orthopnea. There have been no reported palpitations, presyncope or syncope.  He has had increasing lower extremity edema. This has been slowly progressive for  Allergies  Allergen Reactions  . Penicillins Rash    Current Outpatient Prescriptions  Medication Sig Dispense Refill  . acetaminophen (TYLENOL) 500 MG tablet Take 500 mg by mouth every 6 (six) hours as needed for pain.       Marland Kitchen allopurinol (ZYLOPRIM) 300 MG tablet Take 300 mg by mouth every morning.       Marland Kitchen amLODipine (NORVASC) 5 MG tablet Take 2.5 mg by mouth every morning.       Marland Kitchen aspirin 81 MG tablet Take 81 mg by mouth every morning.       . cholecalciferol (VITAMIN D) 1000 UNITS tablet Take 1,000 Units by mouth every morning.      . clobetasol cream (TEMOVATE) 0.05 % Apply 1 application topically daily.      . fluticasone (FLONASE) 50 MCG/ACT nasal spray Place 2 sprays into the nose at bedtime.       . hydrochlorothiazide (HYDRODIURIL) 25 MG tablet Take 12.5 mg by mouth every other day.        Marland Kitchen HYDROcodone-acetaminophen (NORCO/VICODIN) 5-325 MG per tablet Take 1 tablet by mouth every 6 (six) hours as needed for pain.      . metoprolol succinate (TOPROL-XL) 25 MG 24 hr tablet Take 12.5 mg by mouth every morning.      . Multiple Vitamins-Minerals (ICAPS AREDS FORMULA PO) Take 1 capsule by mouth 2 (two) times daily.        . nitroGLYCERIN (NITROSTAT) 0.4 MG SL tablet Place 0.4 mg under the tongue every  5 (five) minutes as needed for chest pain.      Marland Kitchen omeprazole (PRILOSEC) 20 MG capsule Take 20 mg by mouth 2 (two) times daily.       Marland Kitchen oxymetazoline (AFRIN) 0.05 % nasal spray Place 2 sprays into the nose 2 (two) times daily.      . ranitidine (ZANTAC) 150 MG tablet Take 150 mg by mouth 2 (two) times daily.        Marland Kitchen terazosin (HYTRIN) 5 MG capsule Take 5 mg by mouth at bedtime.         No current facility-administered medications for this visit.    Past Medical History  Diagnosis Date  . Heart disease     PCI/Stent RCA 2000  . Vertigo   . Lymphoma   . Hypertension   . GERD (gastroesophageal reflux disease)   . Gout   . RAS (renal artery stenosis)     PCI left renal artery 2005    Past Surgical History  Procedure Laterality Date  . Stent to the righ coronary in 2000    . Lung surgery    . Coronary angioplasty with stent placement    . Appendectomy    . Tonsillectomy    .  Hemorroidectomy    . Cataract extraction     ROS: Post nasal drain, frequent urination.  Otherwise as stated in the HPI and negative for all other systems.  PHYSICAL EXAM BP 129/75  Pulse 81  Ht 5\' 8"  (1.727 m)  Wt 194 lb 3.2 oz (88.089 kg)  BMI 29.54 kg/m2 GENERAL:  Well appearing HEENT:  Pupils equal round and reactive, fundi not visualized, oral mucosa unremarkable NECK:  No jugular venous distention, waveform within normal limits, carotid upstroke brisk and symmetric, no bruits, no thyromegaly LYMPHATICS:  No cervical, inguinal adenopathy LUNGS:  Clear to auscultation bilaterally BACK:  No CVA tenderness CHEST:  Unremarkable HEART:  PMI not displaced or sustained,S1 and S2 within normal limits, no S3, no S4, no clicks, no rubs, no murmurs ABD:  Flat, positive bowel sounds normal in frequency in pitch, no bruits, no rebound, no guarding, no midline pulsatile mass, no hepatomegaly, no splenomegaly EXT:  2 plus pulses throughout, no edema, no cyanosis no clubbing, arthritic changes, trace  edema  EKG:  Sinus rhythm, rate 81, axis within normal limits, intervals within normal limits, no acute ST-T wave changes.  12/16/2012  ASSESSMENT AND PLAN  CAD: The patient has no new sypmtoms.  No further cardiovascular testing is indicated.  We will continue with aggressive risk reduction and meds as listed.  HTN:  The blood pressure is at target. No change in medications is indicated. We will continue with therapeutic lifestyle changes (TLC).  FOOT PAIN:  This is his biggest complaint. It may have to do with neuropathy. It is also likely to do with edema. I do not get him more diuretics for edema as he has renal insufficiency. Unfortunately he has trouble keeping his feet up because he cares for his wife. I will give him a prescription for knee-high compression stockings and Neurontin 300 mg daily. He can have this further followed by Benita Stabile, MD

## 2013-01-09 ENCOUNTER — Other Ambulatory Visit: Payer: Self-pay | Admitting: Cardiology

## 2013-01-24 ENCOUNTER — Encounter: Payer: Self-pay | Admitting: Cardiology

## 2013-02-14 ENCOUNTER — Telehealth: Payer: Self-pay | Admitting: Cardiology

## 2013-02-14 NOTE — Telephone Encounter (Signed)
New Problem:    Patient called in because while at his [facility]. He had a flare up of something, patient was unsure about what it specifically was, and the paramedics were called.  Patient was advised to see his cardiologists.  Please call back.

## 2013-02-14 NOTE — Telephone Encounter (Signed)
Per pt call - having trouble with feeling SOB and edema at feet and ankles.  Reports seeing his "kidney doctor" who adjusted his medications and told him to see Dr Antoine Poche.  Pt aware he has an appt with Lilian Coma on Friday at 11:30 for evaluation.  He was instructed to call back if edema or SOB become worse.  He also reports coughing and having trouble sleeping.  He refuses to go to the ED for eval because he doesn't have anyone to help take care of his wife.

## 2013-02-17 ENCOUNTER — Ambulatory Visit (INDEPENDENT_AMBULATORY_CARE_PROVIDER_SITE_OTHER): Payer: Medicare Other | Admitting: Physician Assistant

## 2013-02-17 ENCOUNTER — Encounter: Payer: Self-pay | Admitting: Physician Assistant

## 2013-02-17 VITALS — BP 114/62 | HR 70 | Wt 197.0 lb

## 2013-02-17 DIAGNOSIS — I10 Essential (primary) hypertension: Secondary | ICD-10-CM

## 2013-02-17 DIAGNOSIS — R0602 Shortness of breath: Secondary | ICD-10-CM

## 2013-02-17 DIAGNOSIS — R609 Edema, unspecified: Secondary | ICD-10-CM

## 2013-02-17 DIAGNOSIS — I509 Heart failure, unspecified: Secondary | ICD-10-CM

## 2013-02-17 DIAGNOSIS — I2510 Atherosclerotic heart disease of native coronary artery without angina pectoris: Secondary | ICD-10-CM

## 2013-02-17 DIAGNOSIS — Z79899 Other long term (current) drug therapy: Secondary | ICD-10-CM

## 2013-02-17 LAB — BASIC METABOLIC PANEL
BUN: 27 mg/dL — ABNORMAL HIGH (ref 6–23)
Calcium: 9.4 mg/dL (ref 8.4–10.5)
Creatinine, Ser: 1.9 mg/dL — ABNORMAL HIGH (ref 0.4–1.5)
GFR: 35.22 mL/min — ABNORMAL LOW (ref >60.00)
Glucose, Bld: 94 mg/dL (ref 70–99)

## 2013-02-17 LAB — BRAIN NATRIURETIC PEPTIDE: Pro B Natriuretic peptide (BNP): 296 pg/mL — ABNORMAL HIGH (ref 0.0–100.0)

## 2013-02-17 NOTE — Patient Instructions (Addendum)
Your physician recommends that you schedule a follow-up appointment in: 1-2  WEEKS WITH  DR [facility]  OR WITH [facility] ON DAY  DR [facility] HERE Your physician has recommended you make the following change in your medication:  INCREASE  FUROSEMIDE TO  40 MG   TWICE A DAY  FOR  3 DAYS   THEN   60 MG EVERY DAY  Your physician recommends that you return for lab work in: TODAY  BMP BNP   AND  IN   BMET IN  WEEK Your physician has requested that you have an echocardiogram. Echocardiography is a painless test that uses sound waves to create images of your heart. It provides your doctor with information about the size and shape of your heart and how well your heart's chambers and valves are working. This procedure takes approximately one hour. There are no restrictions for this procedure.

## 2013-02-17 NOTE — Progress Notes (Signed)
[address], [address] [place], [place]  [address] Phone: [phone] Fax:  [phone]  Date:  02/17/2013   ID:  [name], DOB 01/14/1919, MRN [id number]  PCP:  Lee Stabile, MD  Cardiologist:  Dr. Rollene Foster     History of Present Illness: [name] is a 79 y.o. male who returns for evaluation of LE edema.  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  Last CLite 4/07: no ichemia, EF 73%.  Over the last several weeks, he has developed LE edema and increased DOE. He notes NYHA Class III symptoms.  He often notes increased dyspnea with ADLs.  He did call EMS one night for increased dyspnea.  He felt better after taking 2 NTG and did not go to the ED.  He denies CP.  He does note epigastric discomfort similar to prior GERD relieved by H2RA Rx (zantac).  He denies any symptoms reminiscent of his previous angina.  He does note 4 pillow orthopnea as well as PND.  No syncope.  He notes a cough that is mostly NP.  He sometimes has some yellow sputum production.  No fevers.  He saw his nephrologist who stopped his HCTZ and started Lasix.  He had noted some improvement with this.  But the patient cut his Lasix back to 20 mg QD due to frequent urination.  Labs (4/14):  K 4.1, Cr 1.38, Hgb 13.5 Labs (6/14):  K 4.4, Cr 1.78, Hgb 13.3  Wt Readings from Last 3 Encounters:  02/17/13 197 lb (89.359 kg)  12/16/12 194 lb 3.2 oz (88.089 kg)  08/25/12 195 lb 9.6 oz (88.724 kg)     Past Medical History  Diagnosis Date  . Vertigo   . Lymphoma   . Hypertension   . GERD (gastroesophageal reflux disease)   . Gout   . RAS (renal artery stenosis)     PCI left renal artery 2005  . Coronary artery disease     a. s/p stent to RCA in 2000;  b. Last CLite 4/07: no ichemia, EF 73%  . CKD (chronic kidney disease) stage 3, GFR 30-59 ml/min     Current Outpatient Prescriptions  Medication Sig  Dispense Refill  . acetaminophen (TYLENOL) 500 MG tablet Take 500 mg by mouth every 6 (six) hours as needed for pain.       Marland Kitchen allopurinol (ZYLOPRIM) 300 MG tablet Take 300 mg by mouth every morning.       Marland Kitchen amLODipine (NORVASC) 10 MG tablet Take 5 mg by mouth daily.      Marland Kitchen aspirin 81 MG tablet Take 81 mg by mouth every morning.       . cholecalciferol (VITAMIN D) 1000 UNITS tablet Take 1,000 Units by mouth every morning.      . clobetasol cream (TEMOVATE) 0.05 % Apply 1 application topically daily.      . furosemide (LASIX) 40 MG tablet Take 20 mg by mouth.      Marland Kitchen HYDROcodone-acetaminophen (NORCO/VICODIN) 5-325 MG per tablet Take 1 tablet by mouth every 6 (six) hours as needed for pain.      . metoprolol succinate (TOPROL-XL) 25 MG 24 hr tablet TAKE 1/2 TABLET BY MOUTH EVERY DAY  45 tablet  6  . Multiple Vitamins-Minerals (ICAPS AREDS FORMULA PO) Take 1 capsule by mouth 2 (two) times daily.        Marland Kitchen  nitroGLYCERIN (NITROSTAT) 0.4 MG SL tablet Place 0.4 mg under the tongue every 5 (five) minutes as needed for chest pain.      Marland Kitchen omeprazole (PRILOSEC) 20 MG capsule Take 20 mg by mouth 2 (two) times daily.       Marland Kitchen oxymetazoline (AFRIN) 0.05 % nasal spray Place 2 sprays into the nose 2 (two) times daily.      . ranitidine (ZANTAC) 150 MG tablet Take 150 mg by mouth 2 (two) times daily.        Marland Kitchen terazosin (HYTRIN) 5 MG capsule Take 5 mg by mouth at bedtime.         No current facility-administered medications for this visit.    Allergies:    Allergies  Allergen Reactions  . Penicillins Rash    Social History:  The patient  reports that he quit smoking about 31 years ago. His smoking use included Cigarettes. He has a 25 pack-year smoking history. He has never used smokeless tobacco. He reports that he does not drink alcohol or use illicit drugs.   ROS:  Please see the history of present illness.      All other systems reviewed and negative.   PHYSICAL EXAM: VS:  BP 114/62  Pulse 70  Wt 197  lb (89.359 kg)  BMI 29.96 kg/m2  SpO2 93% Well nourished, well developed, in no acute distress HEENT: normal Neck: min elevated JVD at 90 degrees Cardiac:  normal S1, S2; RRR; no murmur; no S3 Lungs:  Crackles at the bases bilaterally, no wheezing or rhonchi   Abd: distended, non-tender Ext: 2+ bilateral LE edema Skin: warm and dry Neuro:  CNs 2-12 intact, no focal abnormalities noted  EKG:  NSR, HR 65, NSSTTW changes, no change from prior tracing     ASSESSMENT AND PLAN:  1. Acute CHF:  He is volume overloaded.  He needs significant diuresis.  He tends to his wife who has significant Alzheimer's dementia.  He would prefer to avoid being away from her as much as possible.  I will try to diurese him as an outpatient.  I will increase his Lasix to 40 mg bid for 3 days, then reduce this to 60 mg QD.  Check BMET today with a BNP and repeat BMET in 1 week.  Arrange 2D Echo to eval LV function.  If EF is down, would be concerned about ischemic origin.  However, with advanced age and CKD, not sure he is a candidate for aggressive invasive evaluation.  He will need close follow up.  If no improvement at follow up, would have a low threshold to admit for IV diuresis. 2. CAD:  No clear symptoms of angina.  If EF normal, could consider Nuclear study for risk stratification.  Continue ASA. 3. Hypertension:  Controlled.  Continue current therapy.  4. CKD:  Keep close eye on renal fxn with diuresis. 5. Disposition:  F/u with me or Dr. Rollene Foster in 1-2 weeks.   Signed, Tereso Newcomer, PA-C  02/17/2013 12:09 PM

## 2013-02-18 ENCOUNTER — Inpatient Hospital Stay (HOSPITAL_COMMUNITY)
Admission: EM | Admit: 2013-02-18 | Discharge: 2013-02-21 | DRG: 193 | Disposition: A | Discharge status: Home Health | Payer: Medicare Other | Attending: Internal Medicine | Admitting: Internal Medicine

## 2013-02-18 ENCOUNTER — Emergency Department (HOSPITAL_COMMUNITY): Payer: Medicare Other

## 2013-02-18 ENCOUNTER — Other Ambulatory Visit: Payer: Self-pay

## 2013-02-18 ENCOUNTER — Encounter (HOSPITAL_COMMUNITY): Payer: Self-pay | Admitting: Emergency Medicine

## 2013-02-18 DIAGNOSIS — I701 Atherosclerosis of renal artery: Secondary | ICD-10-CM

## 2013-02-18 DIAGNOSIS — Z299 Encounter for prophylactic measures, unspecified: Secondary | ICD-10-CM

## 2013-02-18 DIAGNOSIS — I509 Heart failure, unspecified: Secondary | ICD-10-CM | POA: Diagnosis present

## 2013-02-18 DIAGNOSIS — E876 Hypokalemia: Secondary | ICD-10-CM | POA: Diagnosis present

## 2013-02-18 DIAGNOSIS — Z9861 Coronary angioplasty status: Secondary | ICD-10-CM

## 2013-02-18 DIAGNOSIS — I129 Hypertensive chronic kidney disease with stage 1 through stage 4 chronic kidney disease, or unspecified chronic kidney disease: Secondary | ICD-10-CM | POA: Diagnosis present

## 2013-02-18 DIAGNOSIS — R0602 Shortness of breath: Secondary | ICD-10-CM | POA: Diagnosis present

## 2013-02-18 DIAGNOSIS — J8410 Pulmonary fibrosis, unspecified: Secondary | ICD-10-CM | POA: Diagnosis present

## 2013-02-18 DIAGNOSIS — I519 Heart disease, unspecified: Secondary | ICD-10-CM

## 2013-02-18 DIAGNOSIS — J209 Acute bronchitis, unspecified: Secondary | ICD-10-CM | POA: Diagnosis present

## 2013-02-18 DIAGNOSIS — J189 Pneumonia, unspecified organism: Principal | ICD-10-CM | POA: Diagnosis present

## 2013-02-18 DIAGNOSIS — R0609 Other forms of dyspnea: Secondary | ICD-10-CM

## 2013-02-18 DIAGNOSIS — J440 Chronic obstructive pulmonary disease with acute lower respiratory infection: Secondary | ICD-10-CM | POA: Diagnosis present

## 2013-02-18 DIAGNOSIS — M109 Gout, unspecified: Secondary | ICD-10-CM | POA: Diagnosis present

## 2013-02-18 DIAGNOSIS — I2510 Atherosclerotic heart disease of native coronary artery without angina pectoris: Secondary | ICD-10-CM | POA: Diagnosis present

## 2013-02-18 DIAGNOSIS — J449 Chronic obstructive pulmonary disease, unspecified: Secondary | ICD-10-CM

## 2013-02-18 DIAGNOSIS — E785 Hyperlipidemia, unspecified: Secondary | ICD-10-CM

## 2013-02-18 DIAGNOSIS — Z87891 Personal history of nicotine dependence: Secondary | ICD-10-CM

## 2013-02-18 DIAGNOSIS — R0600 Dyspnea, unspecified: Secondary | ICD-10-CM

## 2013-02-18 DIAGNOSIS — I10 Essential (primary) hypertension: Secondary | ICD-10-CM

## 2013-02-18 DIAGNOSIS — R061 Stridor: Secondary | ICD-10-CM

## 2013-02-18 DIAGNOSIS — I5040 Unspecified combined systolic (congestive) and diastolic (congestive) heart failure: Secondary | ICD-10-CM

## 2013-02-18 DIAGNOSIS — I5033 Acute on chronic diastolic (congestive) heart failure: Secondary | ICD-10-CM | POA: Diagnosis present

## 2013-02-18 DIAGNOSIS — K219 Gastro-esophageal reflux disease without esophagitis: Secondary | ICD-10-CM | POA: Diagnosis present

## 2013-02-18 DIAGNOSIS — N183 Chronic kidney disease, stage 3 unspecified: Secondary | ICD-10-CM | POA: Diagnosis present

## 2013-02-18 LAB — POCT I-STAT TROPONIN I

## 2013-02-18 LAB — HEPATIC FUNCTION PANEL
AST: 19 U/L (ref 0–37)
Bilirubin, Direct: <0.1 mg/dL (ref 0.0–0.3)
Total Bilirubin: 0.5 mg/dL (ref 0.3–1.2)

## 2013-02-18 LAB — BASIC METABOLIC PANEL
Calcium: 9.4 mg/dL (ref 8.4–10.5)
GFR calc Af Amer: 33 mL/min — ABNORMAL LOW (ref >90)
GFR calc non Af Amer: 28 mL/min — ABNORMAL LOW (ref >90)
Glucose, Bld: 122 mg/dL — ABNORMAL HIGH (ref 70–99)
Sodium: 138 mEq/L (ref 135–145)

## 2013-02-18 LAB — CBC
MCH: 31.3 pg (ref 26.0–34.0)
MCHC: 32.6 g/dL (ref 30.0–36.0)
Platelets: 223 10*3/uL (ref 150–400)
RDW: 14.6 % (ref 11.5–15.5)

## 2013-02-18 MED ORDER — SODIUM CHLORIDE 0.9 % IJ SOLN: 3.0000 mL | INTRAMUSCULAR | Status: DC | PRN

## 2013-02-18 MED ORDER — ASPIRIN EC 81 MG PO TBEC
81.0000 mg | DELAYED_RELEASE_TABLET | Freq: Every morning | ORAL | Status: DC
  Administered 2013-02-19 – 2013-02-21 (×3): 81 mg via ORAL
  Filled 2013-02-18 (×3): qty 1

## 2013-02-18 MED ORDER — HYDROCODONE-ACETAMINOPHEN 5-325 MG PO TABS: 1.0000 | ORAL_TABLET | Freq: Four times a day (QID) | ORAL | Status: DC | PRN

## 2013-02-18 MED ORDER — ALLOPURINOL 300 MG PO TABS
300.0000 mg | ORAL_TABLET | Freq: Every morning | ORAL | Status: DC
  Administered 2013-02-19 – 2013-02-21 (×3): 300 mg via ORAL
  Filled 2013-02-18 (×3): qty 1

## 2013-02-18 MED ORDER — METOPROLOL SUCCINATE 12.5 MG HALF TABLET
12.5000 mg | ORAL_TABLET | Freq: Every day | ORAL | Status: DC
  Administered 2013-02-19 – 2013-02-21 (×3): 12.5 mg via ORAL
  Filled 2013-02-18 (×3): qty 1

## 2013-02-18 MED ORDER — SODIUM CHLORIDE 0.9 % IJ SOLN
3.0000 mL | Freq: Two times a day (BID) | INTRAMUSCULAR | Status: DC
  Administered 2013-02-19 – 2013-02-21 (×6): 3 mL via INTRAVENOUS

## 2013-02-18 MED ORDER — FUROSEMIDE 10 MG/ML IJ SOLN
40.0000 mg | Freq: Once | INTRAMUSCULAR | Status: AC
  Administered 2013-02-18: 40 mg via INTRAVENOUS
  Filled 2013-02-18: qty 4

## 2013-02-18 MED ORDER — ALBUTEROL SULFATE (5 MG/ML) 0.5% IN NEBU: 2.5000 mg | INHALATION_SOLUTION | RESPIRATORY_TRACT | Status: DC | PRN

## 2013-02-18 MED ORDER — VITAMIN D3 25 MCG (1000 UNIT) PO TABS
1000.0000 [IU] | ORAL_TABLET | Freq: Every morning | ORAL | Status: DC
  Administered 2013-02-19 – 2013-02-21 (×3): 1000 [IU] via ORAL
  Filled 2013-02-18 (×3): qty 1

## 2013-02-18 MED ORDER — SODIUM CHLORIDE 0.9 % IV SOLN: 250.0000 mL | INTRAVENOUS | Status: DC | PRN

## 2013-02-18 MED ORDER — ENOXAPARIN SODIUM 30 MG/0.3ML ~~LOC~~ SOLN
30.0000 mg | Freq: Every day | SUBCUTANEOUS | Status: DC
  Administered 2013-02-19 – 2013-02-21 (×3): 30 mg via SUBCUTANEOUS
  Filled 2013-02-18 (×3): qty 0.3

## 2013-02-18 MED ORDER — FUROSEMIDE 40 MG PO TABS
40.0000 mg | ORAL_TABLET | Freq: Two times a day (BID) | ORAL | Status: DC
  Administered 2013-02-19: 40 mg via ORAL
  Filled 2013-02-18 (×3): qty 1

## 2013-02-18 MED ORDER — AMLODIPINE BESYLATE 5 MG PO TABS
5.0000 mg | ORAL_TABLET | Freq: Every day | ORAL | Status: DC
  Administered 2013-02-19 – 2013-02-21 (×3): 5 mg via ORAL
  Filled 2013-02-18 (×3): qty 1

## 2013-02-18 MED ORDER — LEVOFLOXACIN IN D5W 750 MG/150ML IV SOLN
750.0000 mg | Freq: Once | INTRAVENOUS | Status: AC
  Administered 2013-02-19: 750 mg via INTRAVENOUS
  Filled 2013-02-18: qty 150

## 2013-02-18 MED ORDER — TERAZOSIN HCL 5 MG PO CAPS
5.0000 mg | ORAL_CAPSULE | Freq: Every day | ORAL | Status: DC
  Administered 2013-02-19 – 2013-02-20 (×3): 5 mg via ORAL
  Filled 2013-02-18 (×4): qty 1

## 2013-02-18 NOTE — ED Notes (Signed)
Contacted by flow manager and informed patient will go to telemetry instead of 6N bed. Called Sophia on 6N and let her know. Report given to Igo, Charity fundraiser.

## 2013-02-18 NOTE — ED Notes (Signed)
Pt sitting in wc talking with son.  O2 sat at 96% on RA. HR at 104.  Pt denies chest pain at present time but does c/o cont cough

## 2013-02-18 NOTE — ED Provider Notes (Signed)
History    CSN: [id number] Arrival date & time 02/18/13  1820  First MD Initiated Contact with Patient 02/18/13 2003     Chief Complaint  Patient presents with  . Chest Pain  . Shortness of Breath   (Consider location/radiation/quality/duration/timing/severity/associated sxs/prior Treatment) HPI Patient presents with chest pain. He states that in particular over the past 2 days he has had persistent discomfort about the sternum and epigastric area.  The pain is sore, pressure-like.  Pain is worse with supine positioning, when he also has dyspnea. Patient states that he has symptoms in spite of using additional pillows over the past few days. The pain is nonradiating, very uncomfortable, not improved by anything. There is no associated vomiting, the patient is nauseous. No syncope, no incontinence, no diarrhea, no fever, no cough. Patient does endorse increasing lower extremity edema.    Past Medical History  Diagnosis Date  . Vertigo   . Lymphoma   . Hypertension   . GERD (gastroesophageal reflux disease)   . Gout   . RAS (renal artery stenosis)     PCI left renal artery 2005  . Coronary artery disease     a. s/p stent to RCA in 2000;  b. Last CLite 4/07: no ichemia, EF 73%  . CKD (chronic kidney disease) stage 3, GFR 30-59 ml/min    Past Surgical History  Procedure Laterality Date  . Stent to the righ coronary in 2000    . Lung surgery    . Coronary angioplasty with stent placement    . Appendectomy    . Tonsillectomy    . Hemorroidectomy    . Cataract extraction     Family History  Problem Relation Age of Onset  . Lung cancer Brother     heavy smoker  . Heart disease Father   . Rheum arthritis Mother    History  Substance Use Topics  . Smoking status: Former Smoker -- 0.50 packs/day for 50 years    Types: Cigarettes    Quit date: 05/03/1981  . Smokeless tobacco: Never Used     Comment: "did not inhale"  . Alcohol Use: No    Review of Systems   Constitutional:       Per HPI, otherwise negative  HENT:       Per HPI, otherwise negative  Respiratory:       Per HPI, otherwise negative  Cardiovascular:       Per HPI, otherwise negative  Gastrointestinal: Positive for nausea. Negative for vomiting and diarrhea.  Endocrine:       Negative aside from HPI  Genitourinary:       Neg aside from HPI   Musculoskeletal:       Per HPI, otherwise negative  Skin: Negative.   Neurological: Negative for syncope.    Allergies  Penicillins  Home Medications   Current Outpatient Rx  Name  Route  Sig  Dispense  Refill  . acetaminophen (TYLENOL) 500 MG tablet   Oral   Take 500 mg by mouth every 6 (six) hours as needed for pain.          Marland Kitchen allopurinol (ZYLOPRIM) 300 MG tablet   Oral   Take 300 mg by mouth every morning.          Marland Kitchen amLODipine (NORVASC) 10 MG tablet   Oral   Take 5 mg by mouth daily.         Marland Kitchen aspirin 81 MG tablet   Oral   Take  81 mg by mouth every morning.          . cholecalciferol (VITAMIN D) 1000 UNITS tablet   Oral   Take 1,000 Units by mouth every morning.         . clobetasol cream (TEMOVATE) 0.05 %   Topical   Apply 1 application topically daily.         . furosemide (LASIX) 40 MG tablet   Oral   Take 20 mg by mouth.         Marland Kitchen HYDROcodone-acetaminophen (NORCO/VICODIN) 5-325 MG per tablet   Oral   Take 1 tablet by mouth every 6 (six) hours as needed for pain.         . metoprolol succinate (TOPROL-XL) 25 MG 24 hr tablet      TAKE 1/2 TABLET BY MOUTH EVERY DAY   45 tablet   6   . Multiple Vitamins-Minerals (ICAPS AREDS FORMULA PO)   Oral   Take 1 capsule by mouth 2 (two) times daily.           . nitroGLYCERIN (NITROSTAT) 0.4 MG SL tablet   Sublingual   Place 0.4 mg under the tongue every 5 (five) minutes as needed for chest pain.         Marland Kitchen omeprazole (PRILOSEC) 20 MG capsule   Oral   Take 20 mg by mouth 2 (two) times daily.          Marland Kitchen oxymetazoline (AFRIN) 0.05 %  nasal spray   Nasal   Place 2 sprays into the nose 2 (two) times daily.         . ranitidine (ZANTAC) 150 MG tablet   Oral   Take 150 mg by mouth 2 (two) times daily.           Marland Kitchen terazosin (HYTRIN) 5 MG capsule   Oral   Take 5 mg by mouth at bedtime.            BP 123/67  Pulse 101  Temp(Src) 98.3 F (36.8 C) (Oral)  Resp 18  Ht 5\' 7"  (1.702 m)  Wt 194 lb (87.998 kg)  BMI 30.38 kg/m2  SpO2 93% Physical Exam  Nursing note and vitals reviewed. Constitutional: He is oriented to person, place, and time. He appears well-developed. No distress.  HENT:  Head: Normocephalic and atraumatic.  Eyes: Conjunctivae and EOM are normal.  Cardiovascular: Normal rate and regular rhythm.   Pulmonary/Chest: Effort normal. No stridor. No respiratory distress.  Mild tenderness to palpation about the lower sternum and superior epigastric area.  Abdominal: Soft. Bowel sounds are normal. He exhibits no distension. There is tenderness.  Musculoskeletal: He exhibits edema.  Le edema - symmetric Neg stemmer's sign  Neurological: He is alert and oriented to person, place, and time.  Skin: Skin is warm and dry.  Psychiatric: He has a normal mood and affect.    ED Course  Procedures (including critical care time) Labs Reviewed  CBC - Abnormal; Notable for the following:    RBC 4.16 (*)    All other components within normal limits  BASIC METABOLIC PANEL - Abnormal; Notable for the following:    Glucose, Bld 122 (*)    BUN 29 (*)    Creatinine, Ser 1.96 (*)    GFR calc non Af Amer 28 (*)    GFR calc Af Amer 33 (*)    All other components within normal limits  PRO B NATRIURETIC PEPTIDE  POCT I-STAT TROPONIN I   No  results found. No diagnosis found. Cardiac 95 sinus rhythm normal Pulse ox 97% room air normal ECG has heart rate 98, with sinus rhythm, nonspecific ST and T wave abnormalities.  It is not appreciably changed from prior.  It is abnormal.  10:06 PM Patient remains in  similar state.  He continues to have expiratory congestive breath sounds. Vital signs remain stable. With elevated BNP he received additional Lasix.  MDM  Patient presents with chest pain and dyspnea.  Notably he is chest pain-free here, and not hypoxic or tachypneic.  X-ray is suggestive of interstitial lung disease and mild cardiomegaly.  With this chronic kidney disease, he did receive diuretics, but requires admission for further evaluation and management of this of dyspnea with chest pain  Gerhard Munch, MD 02/18/13 2208

## 2013-02-18 NOTE — H&P (Signed)
PCP:   Benita Stabile, MD   Chief Complaint:  Sob, fever, chills  HPI: 79 yo male lives at home independently comes in with several days of uri symptoms, cough, fever, chills, sob.  Temp 100.7 earlier at home.  No n/v.  No le edema or swelling.  No cp.  Pt has recently been evaluated by his cardiologist also as outpt as was diagnosed with worsening chf and has echo scheduled in the next week, his lasix dosing was also increased by cardiologist.  These respiratory symptoms started after those changes.  No recent abx use.  Pt also with general malaise and body aches.  No dysuria.  Review of Systems:  Positive and negative as per HPI otherwise all other systems are negative  Past Medical History: Past Medical History  Diagnosis Date  . Vertigo   . Lymphoma   . Hypertension   . GERD (gastroesophageal reflux disease)   . Gout   . RAS (renal artery stenosis)     PCI left renal artery 2005  . Coronary artery disease     a. s/p stent to RCA in 2000;  b. Last CLite 4/07: no ichemia, EF 73%  . CKD (chronic kidney disease) stage 3, GFR 30-59 ml/min    Past Surgical History  Procedure Laterality Date  . Stent to the righ coronary in 2000    . Lung surgery    . Coronary angioplasty with stent placement    . Appendectomy    . Tonsillectomy    . Hemorroidectomy    . Cataract extraction      Medications: Prior to Admission medications   Medication Sig Start Date End Date Taking? Authorizing Provider  acetaminophen (TYLENOL) 500 MG tablet Take 500 mg by mouth every 6 (six) hours as needed for pain.    Yes Historical Provider, MD  allopurinol (ZYLOPRIM) 300 MG tablet Take 300 mg by mouth every morning.    Yes Historical Provider, MD  amLODipine (NORVASC) 10 MG tablet Take 5 mg by mouth daily.   Yes Historical Provider, MD  aspirin 81 MG tablet Take 81 mg by mouth every morning.    Yes Historical Provider, MD  cholecalciferol (VITAMIN D) 1000 UNITS tablet Take 1,000 Units by mouth  every morning.   Yes Historical Provider, MD  furosemide (LASIX) 40 MG tablet Take 40 mg by mouth See admin instructions. Instructed to take 40mg  BID for 3 days starting 02/18/13, then take 60mg  QD thereafter   Yes Historical Provider, MD  HYDROcodone-acetaminophen (NORCO/VICODIN) 5-325 MG per tablet Take 1 tablet by mouth every 6 (six) hours as needed for pain.   Yes Historical Provider, MD  metoprolol succinate (TOPROL-XL) 25 MG 24 hr tablet Take 12.5 mg by mouth daily.   Yes Historical Provider, MD  Multiple Vitamins-Minerals (ICAPS AREDS FORMULA PO) Take 1 capsule by mouth 2 (two) times daily.     Yes Historical Provider, MD  nitroGLYCERIN (NITROSTAT) 0.4 MG SL tablet Place 0.4 mg under the tongue every 5 (five) minutes as needed for chest pain. 05/27/11  Yes Rollene Rotunda, MD  omeprazole (PRILOSEC) 20 MG capsule Take 20 mg by mouth 2 (two) times daily.    Yes Historical Provider, MD  oxymetazoline (AFRIN) 0.05 % nasal spray Place 2 sprays into the nose 2 (two) times daily.   Yes Historical Provider, MD  ranitidine (ZANTAC) 150 MG tablet Take 150 mg by mouth 2 (two) times daily.     Yes Historical Provider, MD  terazosin (HYTRIN) 5 MG  capsule Take 5 mg by mouth at bedtime.     Yes Historical Provider, MD    Allergies:   Allergies  Allergen Reactions  . Penicillins Hives and Rash    Social History:  reports that he quit smoking about 31 years ago. His smoking use included Cigarettes. He has a 25 pack-year smoking history. He has never used smokeless tobacco. He reports that he does not drink alcohol or use illicit drugs.  Family History: Family History  Problem Relation Age of Onset  . Lung cancer Brother     heavy smoker  . Heart disease Father   . Rheum arthritis Mother     Physical Exam: Filed Vitals:   02/18/13 2130 02/18/13 2200 02/18/13 2230 02/18/13 2300  BP: 112/67 118/63 118/67 110/65  Pulse: 75 76 75 73  Temp:      TempSrc:      Resp: 23 23 21 22   Height:       Weight:      SpO2: 98% 99% 97% 95%   General appearance: alert, cooperative and no distress Head: Normocephalic, without obvious abnormality, atraumatic Eyes: negative Nose: Nares normal. Septum midline. Mucosa normal. No drainage or sinus tenderness., no sinus tenderness Neck: no JVD and supple, symmetrical, trachea midline Lungs: rhonchi bilaterally Heart: regular rate and rhythm, S1, S2 normal, no murmur, click, rub or gallop Abdomen: soft, non-tender; bowel sounds normal; no masses,  no organomegaly Extremities: extremities normal, atraumatic, no cyanosis or edema Pulses: 2+ and symmetric Skin: Skin color, texture, turgor normal. No rashes or lesions Neurologic: Grossly normal    Labs on Admission:   Recent Labs  02/17/13 1251 02/18/13 1840  NA 139 138  K 4.9 3.5  CL 102 98  CO2 31 30  GLUCOSE 94 122*  BUN 27* 29*  CREATININE 1.9* 1.96*  CALCIUM 9.4 9.4    Recent Labs  02/18/13 1852  AST 19  ALT 7  ALKPHOS 94  BILITOT 0.5  PROT 7.5  ALBUMIN 4.0    Recent Labs  02/18/13 1852  LIPASE 14    Recent Labs  02/18/13 1840  WBC 9.5  HGB 13.0  HCT 39.9  MCV 95.9  PLT 223    Radiological Exams on Admission: Dg Chest 2 View  02/18/2013   *RADIOLOGY REPORT*  Clinical Data: Chest pain and shortness of breath.  Dizziness.  CHEST - 2 VIEW  Comparison: 01/05/2013.  Findings: The cardiac silhouette remains borderline enlarged. Mildly progressive prominence of the interstitial markings.  No lung masses or pleural fluid seen.  Thoracolumbar spine degenerative changes.  IMPRESSION:  1.  Mildly progressive chronic interstitial lung disease. 2.  Stable borderline cardiomegaly.   Original Report Authenticated By: Beckie Salts, M.D.    Assessment/Plan  79 yo male with sob likely multifactorial pna plus chf Principal Problem:   PNA (pneumonia) Active Problems:   COPD (chronic obstructive pulmonary disease)   SOB (shortness of breath)   CHF (congestive heart  failure)  Place on levaquin, nebs prn.  Continue his lasix dosing recently changed and increased by cardiology.  ckd is at baseline cr 1.9.  bp fine.  pna pathway.  Ck 2 D echo in am.  Tele.  Full code.  Lashann Hagg A 02/18/2013, 11:15 PM

## 2013-02-18 NOTE — ED Notes (Signed)
Patient presents to ED with complaints of chest pain and shortness of breath and head cold symptoms.

## 2013-02-19 ENCOUNTER — Encounter (HOSPITAL_COMMUNITY): Payer: Self-pay | Admitting: *Deleted

## 2013-02-19 DIAGNOSIS — R0989 Other specified symptoms and signs involving the circulatory and respiratory systems: Secondary | ICD-10-CM

## 2013-02-19 DIAGNOSIS — I5033 Acute on chronic diastolic (congestive) heart failure: Secondary | ICD-10-CM

## 2013-02-19 DIAGNOSIS — R0609 Other forms of dyspnea: Secondary | ICD-10-CM

## 2013-02-19 DIAGNOSIS — E876 Hypokalemia: Secondary | ICD-10-CM

## 2013-02-19 DIAGNOSIS — I509 Heart failure, unspecified: Secondary | ICD-10-CM

## 2013-02-19 LAB — URINALYSIS, ROUTINE W REFLEX MICROSCOPIC
Bilirubin Urine: NEGATIVE
Glucose, UA: NEGATIVE mg/dL
Hgb urine dipstick: NEGATIVE
Specific Gravity, Urine: 1.008 (ref 1.005–1.030)
pH: 7 (ref 5.0–8.0)

## 2013-02-19 LAB — CBC WITH DIFFERENTIAL/PLATELET
Basophils Absolute: 0 10*3/uL (ref 0.0–0.1)
Basophils Relative: 0 % (ref 0–1)
Eosinophils Absolute: 0.2 10*3/uL (ref 0.0–0.7)
Eosinophils Relative: 3 % (ref 0–5)
HCT: 38.2 % — ABNORMAL LOW (ref 39.0–52.0)
Hemoglobin: 12.1 g/dL — ABNORMAL LOW (ref 13.0–17.0)
MCH: 30.6 pg (ref 26.0–34.0)
MCHC: 31.7 g/dL (ref 30.0–36.0)
Monocytes Absolute: 0.9 10*3/uL (ref 0.1–1.0)
Monocytes Relative: 13 % — ABNORMAL HIGH (ref 3–12)
Neutro Abs: 4.7 10*3/uL (ref 1.7–7.7)
RDW: 14.7 % (ref 11.5–15.5)

## 2013-02-19 LAB — BASIC METABOLIC PANEL
BUN: 27 mg/dL — ABNORMAL HIGH (ref 6–23)
Calcium: 9.2 mg/dL (ref 8.4–10.5)
Creatinine, Ser: 1.81 mg/dL — ABNORMAL HIGH (ref 0.50–1.35)
GFR calc Af Amer: 36 mL/min — ABNORMAL LOW (ref >90)
GFR calc non Af Amer: 31 mL/min — ABNORMAL LOW (ref >90)

## 2013-02-19 LAB — TROPONIN I: Troponin I: <0.3 ng/mL (ref <0.30)

## 2013-02-19 LAB — EXPECTORATED SPUTUM ASSESSMENT W GRAM STAIN, RFLX TO RESP C

## 2013-02-19 LAB — STREP PNEUMONIAE URINARY ANTIGEN: Strep Pneumo Urinary Antigen: NEGATIVE

## 2013-02-19 MED ORDER — FLUTICASONE PROPIONATE 50 MCG/ACT NA SUSP
2.0000 | Freq: Every day | NASAL | Status: DC
  Administered 2013-02-19 – 2013-02-21 (×3): 2 via NASAL
  Filled 2013-02-19: qty 16

## 2013-02-19 MED ORDER — FUROSEMIDE 40 MG PO TABS
40.0000 mg | ORAL_TABLET | Freq: Two times a day (BID) | ORAL | Status: AC
  Administered 2013-02-20 (×2): 40 mg via ORAL
  Filled 2013-02-19 (×2): qty 1

## 2013-02-19 MED ORDER — BENZONATATE 100 MG PO CAPS
100.0000 mg | ORAL_CAPSULE | Freq: Three times a day (TID) | ORAL | Status: DC | PRN
  Administered 2013-02-19: 100 mg via ORAL
  Filled 2013-02-19: qty 1

## 2013-02-19 MED ORDER — LEVOFLOXACIN IN D5W 750 MG/150ML IV SOLN
750.0000 mg | INTRAVENOUS | Status: DC
Start: 2013-02-21 — End: 2013-02-21
  Administered 2013-02-21: 750 mg via INTRAVENOUS
  Filled 2013-02-19: qty 150

## 2013-02-19 MED ORDER — LEVOFLOXACIN IN D5W 500 MG/100ML IV SOLN: 500.0000 mg | INTRAVENOUS | Status: DC

## 2013-02-19 MED ORDER — FUROSEMIDE 40 MG PO TABS
60.0000 mg | ORAL_TABLET | Freq: Every day | ORAL | Status: DC
  Administered 2013-02-21: 60 mg via ORAL
  Filled 2013-02-19: qty 1

## 2013-02-19 MED ORDER — BENZONATATE 100 MG PO CAPS
200.0000 mg | ORAL_CAPSULE | Freq: Three times a day (TID) | ORAL | Status: DC | PRN
  Administered 2013-02-19 – 2013-02-20 (×2): 200 mg via ORAL
  Filled 2013-02-19 (×2): qty 2

## 2013-02-19 MED ORDER — DM-GUAIFENESIN ER 30-600 MG PO TB12
2.0000 | ORAL_TABLET | Freq: Two times a day (BID) | ORAL | Status: DC
  Administered 2013-02-19 – 2013-02-21 (×5): 2 via ORAL
  Filled 2013-02-19 (×6): qty 2

## 2013-02-19 MED ORDER — POTASSIUM CHLORIDE CRYS ER 20 MEQ PO TBCR
40.0000 meq | EXTENDED_RELEASE_TABLET | ORAL | Status: AC
  Administered 2013-02-19 (×2): 40 meq via ORAL
  Filled 2013-02-19: qty 2

## 2013-02-19 MED ORDER — FUROSEMIDE 10 MG/ML IJ SOLN
40.0000 mg | Freq: Once | INTRAMUSCULAR | Status: AC
  Administered 2013-02-19: 40 mg via INTRAVENOUS
  Filled 2013-02-19: qty 4

## 2013-02-19 MED ORDER — ZOLPIDEM TARTRATE 5 MG PO TABS
5.0000 mg | ORAL_TABLET | Freq: Every evening | ORAL | Status: DC | PRN
  Administered 2013-02-19 – 2013-02-20 (×2): 5 mg via ORAL
  Filled 2013-02-19 (×2): qty 1

## 2013-02-19 NOTE — Evaluation (Signed)
Physical Therapy Evaluation Patient Details Name: [name] MRN: [id number] DOB: 09/08/1918 Today's Date: 02/19/2013 Time: 4098-1191 PT Time Calculation (min): 26 min  PT Assessment / Plan / Recommendation History of Present Illness  79 yo male lives at home independently comes in with several days of uri symptoms, cough, fever, chills, sob.  Temp 100.7 earlier at home  Clinical Impression  Pt admitted for SOB and generalized weakness and will benefit from acute PT services to improve overall mobility to prepare for safe d/c back to ALF with HHPT.     PT Assessment  Patient needs continued PT services    Follow Up Recommendations  Home health PT;Supervision - Intermittent    Equipment Recommendations  None recommended by PT    Recommendations for Other Services     Frequency Min 3X/week    Precautions / Restrictions Precautions Precautions: Fall   Pertinent Vitals/Pain No c/o pain; SaO2 > 95% on RA      Mobility  Bed Mobility Bed Mobility: Supine to Sit;Sitting - Scoot to Edge of Bed Supine to Sit: 4: Min guard;With rails Sitting - Scoot to Edge of Bed: 4: Min guard;With rail Details for Bed Mobility Assistance: Minguard for safety with cues for technique Transfers Transfers: Sit to Stand;Stand to Sit Sit to Stand: 4: Min assist;From bed Stand to Sit: 4: Min assist;To chair/3-in-1 Details for Transfer Assistance: (A) to initiate transfer with cues for hand placement Ambulation/Gait Ambulation/Gait Assistance: 4: Min assist Ambulation Distance (Feet): 20 Feet Assistive device: 1 person hand held assist Ambulation/Gait Assistance Details: (A) to maintain balance with cues for step sequence.   Gait Pattern: Step-through pattern;Decreased stride length;Shuffle Gait velocity: decreased Stairs: No    Exercises     PT Diagnosis: Difficulty walking;Generalized weakness  PT Problem List: Decreased strength;Decreased activity tolerance;Decreased mobility;Decreased  balance;Decreased knowledge of use of DME;Cardiopulmonary status limiting activity PT Treatment Interventions: DME instruction;Gait training;Stair training;Functional mobility training;Therapeutic activities;Therapeutic exercise;Balance training;Patient/family education     PT Goals(Current goals can be found in the care plan section) Acute Rehab PT Goals Patient Stated Goal: To go home with wife at ALF PT Goal Formulation: With patient Time For Goal Achievement: 02/26/13 Potential to Achieve Goals: Good  Visit Information  Last PT Received On: 02/19/13 Assistance Needed: +1 History of Present Illness: 79 yo male lives at home independently comes in with several days of uri symptoms, cough, fever, chills, sob.  Temp 100.7 earlier at home       Prior Functioning  Home Living Family/patient expects to be discharged to:: Assisted living Home Equipment: Dan Humphreys - 2 wheels;Cane - quad Prior Function Level of Independence: Independent with assistive device(s) (SPC at times) Communication Communication: No difficulties Dominant Hand: Right    Cognition  Cognition Arousal/Alertness: Awake/alert Behavior During Therapy: WFL for tasks assessed/performed Overall Cognitive Status: Within Functional Limits for tasks assessed    Extremity/Trunk Assessment Lower Extremity Assessment Lower Extremity Assessment: Generalized weakness   Balance    End of Session PT - End of Session Equipment Utilized During Treatment: Gait belt Activity Tolerance: Patient tolerated treatment well Patient left: in chair;with call bell/phone within reach Nurse Communication: Mobility status  GP     Jolita Haefner 02/19/2013, 12:53 PM  Jake Shark, PT DPT [phone]

## 2013-02-19 NOTE — Progress Notes (Signed)
[facility] PROGRESS NOTE  [name] ZOX:[id number] DOB: 09-Nov-1918 DOA: 02/18/2013 PCP: Benita Stabile, MD  Assessment/Plan: Active Problems Acute Bronchitis with COPD (chronic obstructive pulmonary disease) -continue brochodilators, abx and O2 -add mucinex, continue tessalon pearls  Acute on chronic diastolic CHF (congestive heart failure) -IV lasix this pm and then continue to dose as recommended per cards  chronic interstitial lung disease- per CXR  -continue bronchodilators and supplemental o2 -follow up outpt with pulm Hypokalemia -replace k   Code Status: full Family Communication: no family at bedside Disposition Plan: to home when stable   Consultants:  none  Procedures:  Echo Study Conclusions  Left ventricle: The cavity size was normal. Wall thickness was normal. Systolic function was normal. The estimated ejection fraction was in the range of 55% to 60%. Wall motion was normal; there were no regional wall motion abnormalities.   Antibiotics:  levaquin started 7/12  HPI/Subjective:   Objective: Filed Vitals:   02/18/13 2358 02/19/13 0445 02/19/13 1114 02/19/13 1115  BP: 123/76 114/55 131/68   Pulse: 79 68  83  Temp: 98.4 F (36.9 C) 98.6 F (37 C)    TempSrc: Oral Oral    Resp: 18 18    Height: 5\' 7"  (1.702 m)     Weight: 84.097 kg (185 lb 6.4 oz)     SpO2: 99% 98%      Intake/Output Summary (Last 24 hours) at 02/19/13 1302 Last data filed at 02/19/13 0811  Gross per 24 hour  Intake    213 ml  Output      0 ml  Net    213 ml   Filed Weights   02/18/13 1825 02/18/13 2358  Weight: 87.998 kg (194 lb) 84.097 kg (185 lb 6.4 oz)   Exam:  General: alert & oriented x 3 In NAD Cardiovascular: RRR, nl S1 s2 Respiratory: scattered rhonchi, crackles at bases Abdomen: soft +BS NT/ND, no masses palpable Extremities: No cyanosis and trace edema    Data Reviewed: Basic Metabolic Panel:  Recent Labs Lab 02/17/13 1251  02/18/13 1840 02/19/13 0440  NA 139 138 139  K 4.9 3.5 3.2*  CL 102 98 99  CO2 31 30 32  GLUCOSE 94 122* 86  BUN 27* 29* 27*  CREATININE 1.9* 1.96* 1.81*  CALCIUM 9.4 9.4 9.2   Liver Function Tests:  Recent Labs Lab 02/18/13 1852  AST 19  ALT 7  ALKPHOS 94  BILITOT 0.5  PROT 7.5  ALBUMIN 4.0    Recent Labs Lab 02/18/13 1852  LIPASE 14   No results found for this basename: AMMONIA,  in the last 168 hours CBC:  Recent Labs Lab 02/18/13 1840 02/19/13 0440  WBC 9.5 7.0  NEUTROABS  --  4.7  HGB 13.0 12.1*  HCT 39.9 38.2*  MCV 95.9 96.7  PLT 223 196   Cardiac Enzymes:  Recent Labs Lab 02/19/13 0440  TROPONINI <0.30   BNP (last 3 results)  Recent Labs  02/17/13 1251 02/18/13 1852  PROBNP 296.0* 1508.0*   CBG: No results found for this basename: GLUCAP,  in the last 168 hours  Recent Results (from the past 240 hour(s))  MRSA PCR SCREENING     Status: None   Collection Time    02/19/13 12:43 AM      Result Value Range Status   MRSA by PCR NEGATIVE  NEGATIVE Final   Comment:            The GeneXpert MRSA Assay (FDA  approved for NASAL specimens     only), is one component of a     comprehensive MRSA colonization     surveillance program. It is not     intended to diagnose MRSA     infection nor to guide or     monitor treatment for     MRSA infections.  CULTURE, EXPECTORATED SPUTUM-ASSESSMENT     Status: None   Collection Time    02/19/13  5:35 AM      Result Value Range Status   Specimen Description SPUTUM   Final   Special Requests NONE   Final   Sputum evaluation     Final   Value: THIS SPECIMEN IS ACCEPTABLE. RESPIRATORY CULTURE REPORT TO FOLLOW.   Report Status 02/19/2013 FINAL   Final     Studies: Dg Chest 2 View  02/18/2013   *RADIOLOGY REPORT*  Clinical Data: Chest pain and shortness of breath.  Dizziness.  CHEST - 2 VIEW  Comparison: 01/05/2013.  Findings: The cardiac silhouette remains borderline enlarged. Mildly  progressive prominence of the interstitial markings.  No lung masses or pleural fluid seen.  Thoracolumbar spine degenerative changes.  IMPRESSION:  1.  Mildly progressive chronic interstitial lung disease. 2.  Stable borderline cardiomegaly.   Original Report Authenticated By: Beckie Salts, M.D.    Scheduled Meds: . allopurinol  300 mg Oral q morning - 10a  . amLODipine  5 mg Oral Daily  . aspirin EC  81 mg Oral q morning - 10a  . cholecalciferol  1,000 Units Oral q morning - 10a  . enoxaparin (LOVENOX) injection  30 mg Subcutaneous Daily  . furosemide  40 mg Intravenous Once  . [START ON 02/20/2013] furosemide  40 mg Oral BID  . [START ON 02/21/2013] furosemide  60 mg Oral Daily  . [START ON 02/21/2013] levofloxacin (LEVAQUIN) IV  750 mg Intravenous Q48H  . metoprolol succinate  12.5 mg Oral Daily  . potassium chloride  40 mEq Oral Q4H  . sodium chloride  3 mL Intravenous Q12H  . terazosin  5 mg Oral QHS   Continuous Infusions:   Principal Problem:   PNA (pneumonia) Active Problems:   COPD (chronic obstructive pulmonary disease)   SOB (shortness of breath)   CHF (congestive heart failure)    Time spent: 35    Averly Ericson C  [facility] Pager [phone]. If 7PM-7AM, please contact night-coverage at [email or web address], password [facility] 02/19/2013, 1:02 PM  LOS: 1 day

## 2013-02-19 NOTE — Progress Notes (Signed)
Echo Lab  2D Echocardiogram completed.  Simrat Kendrick L Sheyann Sulton, RDCS 02/19/2013 3:54 PM

## 2013-02-20 ENCOUNTER — Inpatient Hospital Stay (HOSPITAL_COMMUNITY): Payer: Medicare Other

## 2013-02-20 DIAGNOSIS — J209 Acute bronchitis, unspecified: Secondary | ICD-10-CM

## 2013-02-20 LAB — CBC
Hemoglobin: 12 g/dL — ABNORMAL LOW (ref 13.0–17.0)
MCH: 30.4 pg (ref 26.0–34.0)
MCHC: 31.3 g/dL (ref 30.0–36.0)
MCV: 97 fL (ref 78.0–100.0)

## 2013-02-20 LAB — BASIC METABOLIC PANEL
BUN: 29 mg/dL — ABNORMAL HIGH (ref 6–23)
CO2: 34 mEq/L — ABNORMAL HIGH (ref 19–32)
GFR calc non Af Amer: 28 mL/min — ABNORMAL LOW (ref >90)
Glucose, Bld: 96 mg/dL (ref 70–99)
Potassium: 3.9 mEq/L (ref 3.5–5.1)

## 2013-02-20 MED ORDER — PANTOPRAZOLE SODIUM 40 MG PO TBEC
40.0000 mg | DELAYED_RELEASE_TABLET | Freq: Every day | ORAL | Status: DC
  Administered 2013-02-21: 40 mg via ORAL
  Filled 2013-02-20: qty 1

## 2013-02-20 MED ORDER — SALINE SPRAY 0.65 % NA SOLN
1.0000 | NASAL | Status: DC | PRN
  Administered 2013-02-20: 1 via NASAL
  Filled 2013-02-20: qty 44

## 2013-02-20 MED ORDER — METHYLPREDNISOLONE SODIUM SUCC 40 MG IJ SOLR
40.0000 mg | Freq: Once | INTRAMUSCULAR | Status: AC
  Administered 2013-02-20: 40 mg via INTRAVENOUS
  Filled 2013-02-20: qty 1

## 2013-02-20 MED ORDER — LEVALBUTEROL HCL 0.63 MG/3ML IN NEBU
0.6300 mg | INHALATION_SOLUTION | Freq: Four times a day (QID) | RESPIRATORY_TRACT | Status: DC
  Administered 2013-02-21 (×3): 0.63 mg via RESPIRATORY_TRACT
  Filled 2013-02-20 (×4): qty 3

## 2013-02-20 MED ORDER — SODIUM CHLORIDE 0.9 % IV SOLN: 250.0000 mL | INTRAVENOUS | Status: DC | PRN

## 2013-02-20 MED ORDER — PREDNISONE 20 MG PO TABS
40.0000 mg | ORAL_TABLET | Freq: Every day | ORAL | Status: DC
  Administered 2013-02-21: 40 mg via ORAL
  Filled 2013-02-20 (×2): qty 2

## 2013-02-20 NOTE — Progress Notes (Signed)
[facility] PROGRESS NOTE  [name] WUJ:[id number] DOB: 12-17-1918 DOA: 02/18/2013 PCP: Benita Stabile, MD  Assessment/Plan: Active Problems Acute Bronchitis with COPD (chronic obstructive pulmonary disease) -schedule brochodilators, add steroids, abx and O2 -add mucinex, continue tessalon pearls  Acute on chronic diastolic CHF (congestive heart failure) -po lasix continue to dose as recommended per cards  chronic interstitial lung disease- per CXR  -continue bronchodilators and supplemental o2 -follow up outpt with pulm Hypokalemia -resolved   Code Status: full Family Communication: no family at bedside Disposition Plan: to home when stable   Consultants:  none  Procedures:  Echo Study Conclusions  Left ventricle: The cavity size was normal. Wall thickness was normal. Systolic function was normal. The estimated ejection fraction was in the range of 55% to 60%. Wall motion was normal; there were no regional wall motion abnormalities.   Antibiotics:  levaquin started 7/12  HPI/Subjective: Still with increased cough, per nsg desatted last pm  Objective: Filed Vitals:   02/19/13 2039 02/20/13 0702 02/20/13 0740 02/20/13 1042  BP: 108/62 138/73  136/75  Pulse: 67 92  90  Temp: 97.5 F (36.4 C) 98 F (36.7 C)    TempSrc: Oral Oral    Resp: 18 20    Height:      Weight:  83 kg (182 lb 15.7 oz)    SpO2: 100% 99% 98%     Intake/Output Summary (Last 24 hours) at 02/20/13 1416 Last data filed at 02/20/13 1230  Gross per 24 hour  Intake   1183 ml  Output   1430 ml  Net   -247 ml   Filed Weights   02/18/13 1825 02/18/13 2358 02/20/13 0702  Weight: 87.998 kg (194 lb) 84.097 kg (185 lb 6.4 oz) 83 kg (182 lb 15.7 oz)   Exam:  General: alert & oriented x 3 In NAD Cardiovascular: RRR, nl S1 s2 Respiratory: scattered rhonchi, decreased air entry bil, decreased crackles  Abdomen: soft +BS NT/ND, no masses palpable Extremities: No cyanosis and  trace edema    Data Reviewed: Basic Metabolic Panel:  Recent Labs Lab 02/17/13 1251 02/18/13 1840 02/19/13 0440 02/20/13 0538  NA 139 138 139 140  K 4.9 3.5 3.2* 3.9  CL 102 98 99 98  CO2 31 30 32 34*  GLUCOSE 94 122* 86 96  BUN 27* 29* 27* 29*  CREATININE 1.9* 1.96* 1.81* 1.99*  CALCIUM 9.4 9.4 9.2 9.1   Liver Function Tests:  Recent Labs Lab 02/18/13 1852  AST 19  ALT 7  ALKPHOS 94  BILITOT 0.5  PROT 7.5  ALBUMIN 4.0    Recent Labs Lab 02/18/13 1852  LIPASE 14   No results found for this basename: AMMONIA,  in the last 168 hours CBC:  Recent Labs Lab 02/18/13 1840 02/19/13 0440 02/20/13 0538  WBC 9.5 7.0 7.0  NEUTROABS  --  4.7  --   HGB 13.0 12.1* 12.0*  HCT 39.9 38.2* 38.3*  MCV 95.9 96.7 97.0  PLT 223 196 212   Cardiac Enzymes:  Recent Labs Lab 02/19/13 0440  TROPONINI <0.30   BNP (last 3 results)  Recent Labs  02/17/13 1251 02/18/13 1852  PROBNP 296.0* 1508.0*   CBG: No results found for this basename: GLUCAP,  in the last 168 hours  Recent Results (from the past 240 hour(s))  MRSA PCR SCREENING     Status: None   Collection Time    02/19/13 12:43 AM      Result Value Range Status  MRSA by PCR NEGATIVE  NEGATIVE Final   Comment:            The GeneXpert MRSA Assay (FDA     approved for NASAL specimens     only), is one component of a     comprehensive MRSA colonization     surveillance program. It is not     intended to diagnose MRSA     infection nor to guide or     monitor treatment for     MRSA infections.  CULTURE, BLOOD (ROUTINE X 2)     Status: None   Collection Time    02/19/13  4:40 AM      Result Value Range Status   Specimen Description BLOOD RIGHT ARM   Final   Special Requests BOTTLES DRAWN AEROBIC AND ANAEROBIC 10CC EA   Final   Culture  Setup Time 02/19/2013 15:16   Final   Culture     Final   Value:        BLOOD CULTURE RECEIVED NO GROWTH TO DATE CULTURE WILL BE HELD FOR 5 DAYS BEFORE ISSUING A  FINAL NEGATIVE REPORT   Report Status PENDING   Incomplete  CULTURE, EXPECTORATED SPUTUM-ASSESSMENT     Status: None   Collection Time    02/19/13  5:35 AM      Result Value Range Status   Specimen Description SPUTUM   Final   Special Requests NONE   Final   Sputum evaluation     Final   Value: THIS SPECIMEN IS ACCEPTABLE. RESPIRATORY CULTURE REPORT TO FOLLOW.   Report Status 02/19/2013 FINAL   Final  CULTURE, RESPIRATORY (NON-EXPECTORATED)     Status: None   Collection Time    02/19/13  5:35 AM      Result Value Range Status   Specimen Description SPUTUM   Final   Special Requests ADDED 0620   Final   Gram Stain     Final   Value: FEW WBC PRESENT, PREDOMINANTLY PMN     NO SQUAMOUS EPITHELIAL CELLS SEEN     FEW GRAM POSITIVE RODS     RARE GRAM POSITIVE COCCI     IN PAIRS   Culture Culture reincubated for better growth   Final   Report Status PENDING   Incomplete     Studies: Dg Chest 2 View  02/20/2013   *RADIOLOGY REPORT*  Clinical Data: Shortness of breath.  Infiltrates.  CHEST - 2 VIEW  Comparison: Two-view chest 02/18/2013 and 01/05/2013.  Findings: The heart is mildly enlarged.  Slight increase in the diffuse interstitial pattern persists.  No significant effusions are present.  Degenerative changes of the thoracolumbar spine are evident.  IMPRESSION:  1.  Persistent slight increased interstitial pattern suggesting mild edema or inflammation superimposed on chronic interstitial coarsening. 2.  Minimal bibasilar atelectasis without significant airspace consolidation. 3.  Degenerative changes of the thoracolumbar spine.   Original Report Authenticated By: Marin Roberts, M.D.   Dg Chest 2 View  02/18/2013   *RADIOLOGY REPORT*  Clinical Data: Chest pain and shortness of breath.  Dizziness.  CHEST - 2 VIEW  Comparison: 01/05/2013.  Findings: The cardiac silhouette remains borderline enlarged. Mildly progressive prominence of the interstitial markings.  No lung masses or pleural  fluid seen.  Thoracolumbar spine degenerative changes.  IMPRESSION:  1.  Mildly progressive chronic interstitial lung disease. 2.  Stable borderline cardiomegaly.   Original Report Authenticated By: Beckie Salts, M.D.    Scheduled Meds: . allopurinol  300  mg Oral q morning - 10a  . amLODipine  5 mg Oral Daily  . aspirin EC  81 mg Oral q morning - 10a  . cholecalciferol  1,000 Units Oral q morning - 10a  . dextromethorphan-guaiFENesin  2 tablet Oral BID  . enoxaparin (LOVENOX) injection  30 mg Subcutaneous Daily  . fluticasone  2 spray Each Nare Daily  . furosemide  40 mg Oral BID  . [START ON 02/21/2013] furosemide  60 mg Oral Daily  . [START ON 02/21/2013] levofloxacin (LEVAQUIN) IV  750 mg Intravenous Q48H  . metoprolol succinate  12.5 mg Oral Daily  . sodium chloride  3 mL Intravenous Q12H  . terazosin  5 mg Oral QHS   Continuous Infusions:   Principal Problem:   PNA (pneumonia) Active Problems:   COPD (chronic obstructive pulmonary disease)   SOB (shortness of breath)   CHF (congestive heart failure)    Time spent: 35    Kadesia Robel C  [facility] Pager [phone]. If 7PM-7AM, please contact night-coverage at [email or web address], password [facility] 02/20/2013, 2:16 PM  LOS: 2 days

## 2013-02-21 LAB — LEGIONELLA ANTIGEN, URINE

## 2013-02-21 LAB — BASIC METABOLIC PANEL
BUN: 32 mg/dL — ABNORMAL HIGH (ref 6–23)
Calcium: 9.3 mg/dL (ref 8.4–10.5)
Creatinine, Ser: 1.9 mg/dL — ABNORMAL HIGH (ref 0.50–1.35)
GFR calc non Af Amer: 29 mL/min — ABNORMAL LOW (ref >90)
Glucose, Bld: 154 mg/dL — ABNORMAL HIGH (ref 70–99)
Sodium: 136 mEq/L (ref 135–145)

## 2013-02-21 LAB — CULTURE, RESPIRATORY W GRAM STAIN

## 2013-02-21 MED ORDER — DM-GUAIFENESIN ER 30-600 MG PO TB12: 2.0000 | ORAL_TABLET | Freq: Two times a day (BID) | ORAL | Status: DC

## 2013-02-21 MED ORDER — LEVOFLOXACIN 750 MG PO TABS: 750.0000 mg | ORAL_TABLET | ORAL | Status: DC

## 2013-02-21 MED ORDER — BUDESONIDE-FORMOTEROL FUMARATE 160-4.5 MCG/ACT IN AERO: 2.0000 | INHALATION_SPRAY | Freq: Two times a day (BID) | RESPIRATORY_TRACT | Status: DC

## 2013-02-21 MED ORDER — FLUTICASONE PROPIONATE 50 MCG/ACT NA SUSP: 2.0000 | Freq: Every day | NASAL | Status: AC

## 2013-02-21 MED ORDER — FUROSEMIDE 40 MG PO TABS: 60.0000 mg | ORAL_TABLET | ORAL | Status: DC

## 2013-02-21 MED ORDER — PREDNISONE 20 MG PO TABS: 40.0000 mg | ORAL_TABLET | Freq: Every day | ORAL | Status: DC

## 2013-02-21 MED ORDER — BENZONATATE 200 MG PO CAPS: 200.0000 mg | ORAL_CAPSULE | Freq: Three times a day (TID) | ORAL | Status: DC | PRN

## 2013-02-21 MED ORDER — LEVALBUTEROL TARTRATE 45 MCG/ACT IN AERO: 2.0000 | INHALATION_SPRAY | RESPIRATORY_TRACT | Status: DC | PRN

## 2013-02-21 NOTE — Progress Notes (Signed)
Pt O2 sat still dropping to 80s when pt asleep. Pt with snore when sleeping. Pt on 4L O2. Pt O2 sat up to 91-92 when awake. Pt with no complaints. MD paged via [facility] text page. Will continue to monitor pt. Baron Hamper, RN

## 2013-02-21 NOTE — Progress Notes (Signed)
Physical Therapy Treatment Patient Details Name: [name] MRN: [id number] DOB: 1918-11-07 Today's Date: 02/21/2013 Time: 4098-1191 PT Time Calculation (min): 25 min  PT Assessment / Plan / Recommendation  PT Comments   Pt requires use of RW for safe ambulation. Pt currently unable to care for spouse as pt now requires O2 and RW for safe ambulation. Pt to strongly benefit from HHPT and 24/7 supervision for safety due to increased falls risk and management of O2.  Follow Up Recommendations  Home health PT;Supervision - Intermittent     Does the patient have the potential to tolerate intense rehabilitation     Barriers to Discharge        Equipment Recommendations  RW - pt has already at home    Recommendations for Other Services    Frequency Min 3X/week   Progress towards PT Goals Progress towards PT goals: Progressing toward goals  Plan Current plan remains appropriate    Precautions / Restrictions Precautions Precautions: Fall Restrictions Weight Bearing Restrictions: No   Pertinent Vitals/Pain SATURATION QUALIFICATIONS: (This note is used to comply with regulatory documentation for home oxygen)  Patient Saturations on Room Air at Rest = 88%  Patient Saturations on Room Air while Ambulating = 86%  Patient Saturations on 3 Liters of oxygen while Ambulating = 96%  Please briefly explain why patient needs home oxygen: pt <90% on RA at rest and during amb    Mobility  Bed Mobility Bed Mobility: Supine to Sit;Sitting - Scoot to Edge of Bed Supine to Sit: 4: Min guard;With rails Sitting - Scoot to Edge of Bed: 4: Min guard;With rail Details for Bed Mobility Assistance: increased time, labored effort, definite use of UE Transfers Transfers: Sit to Stand;Stand to Sit Sit to Stand: 4: Min guard;With upper extremity assist;From bed Stand to Sit: 5: Supervision;With upper extremity assist;With armrests;To chair/3-in-1 Ambulation/Gait Ambulation/Gait Assistance: 4: Min  guard Ambulation Distance (Feet): 200 Feet Assistive device: Rolling walker Ambulation/Gait Assistance Details: attempted HHA however pt preferred RW and recognizes increased stabilty with RW.Pt unsafe to amb without RW Gait Pattern: Step-through pattern;Decreased stride length;Shuffle Gait velocity: decreased General Gait Details: no episodes of LOB, v/c's to stay in walker when turning.  Stairs: No    Exercises     PT Diagnosis:    PT Problem List:   PT Treatment Interventions:     PT Goals (current goals can now be found in the care plan section) Acute Rehab PT Goals Patient Stated Goal: To go home with wife at ALF  Visit Information  Last PT Received On: 02/21/13 Assistance Needed: +1 History of Present Illness: 79 yo male lives at home independently comes in with several days of uri symptoms, cough, fever, chills, sob.  Temp 100.7 earlier at home    Subjective Data  Subjective: Pt received supine in bed agreeable to PT Patient Stated Goal: To go home with wife at ALF   Cognition  Cognition Arousal/Alertness: Awake/alert Behavior During Therapy: WFL for tasks assessed/performed Overall Cognitive Status: Within Functional Limits for tasks assessed    Balance     End of Session PT - End of Session Equipment Utilized During Treatment: Gait belt Activity Tolerance: Patient tolerated treatment well Patient left: in chair;with call bell/phone within reach Nurse Communication: Mobility status   GP     [name] 02/21/2013, 12:30 PM  [name], PT, DPT Pager #: [phone] Office #: [phone]

## 2013-02-21 NOTE — Progress Notes (Addendum)
Pt O2 sat in low 80s and 70s on 2 L. Pt O2 set to 3L. O2 sat up to 91 and continues to drop to 80s. Pt up to 4L O2 with O2 sat up to 93%. Pt HOB elevated as well. Pt is alert and oriented and is not short of breath. Pt with no complaints on assessment Pt has rhonchi on assessment. Will continue to monitor pt. Baron Hamper, RN 02/21/2013

## 2013-02-21 NOTE — Clinical Social Work Psychosocial (Signed)
     Clinical Social Work Department BRIEF PSYCHOSOCIAL ASSESSMENT 02/21/2013  Patient:  [name], [name]     Account Number:  [id number]     Admit date:  02/18/2013  Clinical Social Worker:  Lavell Luster  Date/Time:  02/21/2013 02:55 PM  Referred by:  Physician  Date Referred:  02/20/2013 Referred for  Other - See comment   Other Referral:   ? return to independent vs increased level of care.   Interview type:  Patient Other interview type:    PSYCHOSOCIAL DATA Living Status:  FACILITY Admitted from facility:  [facility] Level of care:  Independent Living Primary support name:  [name], [id number] Primary support relationship to patient:  CHILD, ADULT Degree of support available:   Strong    CURRENT CONCERNS Current Concerns  None Noted   Other Concerns:    SOCIAL WORK ASSESSMENT / PLAN CSW met with patient. Patient is a resident of [facility] where he lives with his wife. Patient feels that he manages well at independent level and plans to return there at discharge. Per MD patient is stable for discharge today. Home health will be arranged by RN case manager Ronnie Derby. Will notify RN case manager of above. No further CSW needs identified. CSW signing off.   Assessment/plan status:  No Further Intervention Required Other assessment/ plan:   Information/referral to community resources:   NA    PATIENTS/FAMILYS RESPONSE TO PLAN OF CARE: Patient is alert and oriented. Patient was pleasant and ready for discharge today. Patient stated that he would like to return to independent living and that his wife would be upset if he went to rehab. Patient seemed appreciative of CSW visit.

## 2013-02-21 NOTE — Progress Notes (Signed)
SATURATION QUALIFICATIONS: (This note is used to comply with regulatory documentation for home oxygen)  Patient Saturations on Room Air at Rest = 88%      

## 2013-02-21 NOTE — Discharge Summary (Signed)
Physician Discharge Summary  [name] ZOX:[id number] DOB: 07-Sep-1918 DOA: 02/18/2013  PCP: Benita Stabile, MD  Admit date: 02/18/2013 Discharge date: 02/21/2013  Time spent: >30 minutes  Recommendations for Outpatient Follow-up:  Dr. Antoine Poche next week- call for appointment upon discharge Dr. Clovis Riley in one to 2 weeks, call for appointment upon the Pulmonology, call for appointment upon discharge Follow up labs Bmet to follow up on potassium and creatinine  Discharge Diagnoses:  Principal Problem:   PNA (pneumonia) Active Problems:   COPD (chronic obstructive pulmonary disease)   SOB (shortness of breath)   CHF (congestive heart failure)   Discharge Condition: Improved/stable  Diet recommendation: Heart healthy  Filed Weights   02/18/13 2358 02/20/13 0702 02/21/13 0644  Weight: 84.097 kg (185 lb 6.4 oz) 83 kg (182 lb 15.7 oz) 83.5 kg (184 lb 1.4 oz)    History of present illness:  79 yo male lives at home independently comes in with several days of uri symptoms, cough, fever, chills, sob. Temp 100.7 earlier at home. No n/v. No le edema or swelling. No cp. Pt has recently been evaluated by his cardiologist also as outpt as was diagnosed with worsening chf and has echo scheduled in the next week, his lasix dosing was also increased by cardiologist. These respiratory symptoms started after those changes. No recent abx use. Pt also with general malaise and body aches. No dysuria.   Hospital Course:  Active Problems  Acute Bronchitis with COPD (chronic obstructive pulmonary disease)  -As discussed above, upon patient patient was placed on nebulized bronchodilators and antibiotics and supplemental. On followup steroids were added. He was maintained on antitussives/mucolytics as well and he improved with above interventions. He was assess for home O2 and his O2 sats dropped to 87-88% on room air. To be discharged on nasal canula oxygen 2 L and is to follow up outpatient. He  discharged on oral prednisone, bronchodilators and antibiotics -Patient had a chest x-ray done which showed mildly progressive chronic interstitial lung disease, stable borderline cardiomegaly. It is noted that patient has seen Dr. Sherene Sires in the past, his to followup with pulmonary outpatient Acute on chronic diastolic CHF (congestive heart failure)  -As discussed above, patient had been seen by cardiology on the of admission and his Lasix was adjusted.upon admission he had a 2-D echo which showed an EF of 55-60% with normal wall motion. He was diuresed with IV Lasix initially and then changed to po lasix c and dosed as recommended per cards prior to admission. He is clinically improved and is to follow up outpatient with cardiology chronic interstitial lung disease- per CXR  -continue bronchodilators and supplemental o2  -follow up outpt with pulm  Hypokalemia  -resolved, potassium was replaced.  Consultants:  none Procedures:  Echo Study Conclusions  Left ventricle: The cavity size was normal. Wall thickness was normal. Systolic function was normal. The estimated ejection fraction was in the range of 55% to 60%. Wall motion was normal; there were no regional wall motion abnormalities.      Discharge Exam: Filed Vitals:   02/21/13 0644 02/21/13 0908 02/21/13 1015 02/21/13 1016  BP: 129/64  110/66 110/66  Pulse: 93   96  Temp: 97.6 F (36.4 C)     TempSrc: Oral     Resp: 20     Height:      Weight: 83.5 kg (184 lb 1.4 oz)     SpO2: 95% 98%      Exam:  General: alert & oriented  x 3 In NAD  Cardiovascular: RRR, nl S1 s2  Respiratory: coarse BS , moderate air, no crackles and no wheezes Abdomen: soft +BS NT/ND, no masses palpable  Extremities: No cyanosis and no edema   Discharge Instructions  Discharge Orders   Future Orders Complete By Expires     Diet - low sodium heart healthy  As directed     Increase activity slowly  As directed         Medication List     STOP taking these medications       oxymetazoline 0.05 % nasal spray  Commonly known as:  AFRIN      TAKE these medications       acetaminophen 500 MG tablet  Commonly known as:  TYLENOL  Take 500 mg by mouth every 6 (six) hours as needed for pain.     allopurinol 300 MG tablet  Commonly known as:  ZYLOPRIM  Take 300 mg by mouth every morning.     amLODipine 10 MG tablet  Commonly known as:  NORVASC  Take 5 mg by mouth daily.     aspirin 81 MG tablet  Take 81 mg by mouth every morning.     benzonatate 200 MG capsule  Commonly known as:  TESSALON  Take 1 capsule (200 mg total) by mouth 3 (three) times daily as needed for cough.     budesonide-formoterol 160-4.5 MCG/ACT inhaler  Commonly known as:  SYMBICORT  Inhale 2 puffs into the lungs 2 (two) times daily.     cholecalciferol 1000 UNITS tablet  Commonly known as:  VITAMIN D  Take 1,000 Units by mouth every morning.     dextromethorphan-guaiFENesin 30-600 MG per 12 hr tablet  Commonly known as:  MUCINEX DM  Take 2 tablets by mouth 2 (two) times daily.     fluticasone 50 MCG/ACT nasal spray  Commonly known as:  FLONASE  Place 2 sprays into the nose daily.     furosemide 40 MG tablet  Commonly known as:  LASIX  Take 1.5 tablets (60 mg total) by mouth See admin instructions.     HYDROcodone-acetaminophen 5-325 MG per tablet  Commonly known as:  NORCO/VICODIN  Take 1 tablet by mouth every 6 (six) hours as needed for pain.     ICAPS AREDS FORMULA PO  Take 1 capsule by mouth 2 (two) times daily.     levalbuterol 45 MCG/ACT inhaler  Commonly known as:  XOPENEX HFA  Inhale 2 puffs into the lungs every 4 (four) hours as needed for wheezing.     levofloxacin 750 MG tablet  Commonly known as:  LEVAQUIN  Take 1 tablet (750 mg total) by mouth every other day.  Start taking on:  02/23/2013     metoprolol succinate 25 MG 24 hr tablet  Commonly known as:  TOPROL-XL  Take 12.5 mg by mouth daily.     nitroGLYCERIN  0.4 MG SL tablet  Commonly known as:  NITROSTAT  Place 0.4 mg under the tongue every 5 (five) minutes as needed for chest pain.     omeprazole 20 MG capsule  Commonly known as:  PRILOSEC  Take 20 mg by mouth 2 (two) times daily.     predniSONE 20 MG tablet  Commonly known as:  DELTASONE  Take 2 tablets (40 mg total) by mouth daily with breakfast.     ranitidine 150 MG tablet  Commonly known as:  ZANTAC  Take 150 mg by mouth 2 (two) times daily.  terazosin 5 MG capsule  Commonly known as:  HYTRIN  Take 5 mg by mouth at bedtime.       Allergies  Allergen Reactions  . Penicillins Hives and Rash      The results of significant diagnostics from this hospitalization (including imaging, microbiology, ancillary and laboratory) are listed below for reference.    Significant Diagnostic Studies: Dg Chest 2 View  02/20/2013   *RADIOLOGY REPORT*  Clinical Data: Shortness of breath.  Infiltrates.  CHEST - 2 VIEW  Comparison: Two-view chest 02/18/2013 and 01/05/2013.  Findings: The heart is mildly enlarged.  Slight increase in the diffuse interstitial pattern persists.  No significant effusions are present.  Degenerative changes of the thoracolumbar spine are evident.  IMPRESSION:  1.  Persistent slight increased interstitial pattern suggesting mild edema or inflammation superimposed on chronic interstitial coarsening. 2.  Minimal bibasilar atelectasis without significant airspace consolidation. 3.  Degenerative changes of the thoracolumbar spine.   Original Report Authenticated By: Marin Roberts, M.D.   Dg Chest 2 View  02/18/2013   *RADIOLOGY REPORT*  Clinical Data: Chest pain and shortness of breath.  Dizziness.  CHEST - 2 VIEW  Comparison: 01/05/2013.  Findings: The cardiac silhouette remains borderline enlarged. Mildly progressive prominence of the interstitial markings.  No lung masses or pleural fluid seen.  Thoracolumbar spine degenerative changes.  IMPRESSION:  1.  Mildly  progressive chronic interstitial lung disease. 2.  Stable borderline cardiomegaly.   Original Report Authenticated By: Beckie Salts, M.D.    Microbiology: Recent Results (from the past 240 hour(s))  MRSA PCR SCREENING     Status: None   Collection Time    02/19/13 12:43 AM      Result Value Range Status   MRSA by PCR NEGATIVE  NEGATIVE Final   Comment:            The GeneXpert MRSA Assay (FDA     approved for NASAL specimens     only), is one component of a     comprehensive MRSA colonization     surveillance program. It is not     intended to diagnose MRSA     infection nor to guide or     monitor treatment for     MRSA infections.  CULTURE, BLOOD (ROUTINE X 2)     Status: None   Collection Time    02/19/13  4:40 AM      Result Value Range Status   Specimen Description BLOOD RIGHT ARM   Final   Special Requests BOTTLES DRAWN AEROBIC AND ANAEROBIC 10CC EA   Final   Culture  Setup Time 02/19/2013 15:16   Final   Culture     Final   Value:        BLOOD CULTURE RECEIVED NO GROWTH TO DATE CULTURE WILL BE HELD FOR 5 DAYS BEFORE ISSUING A FINAL NEGATIVE REPORT   Report Status PENDING   Incomplete  CULTURE, EXPECTORATED SPUTUM-ASSESSMENT     Status: None   Collection Time    02/19/13  5:35 AM      Result Value Range Status   Specimen Description SPUTUM   Final   Special Requests NONE   Final   Sputum evaluation     Final   Value: THIS SPECIMEN IS ACCEPTABLE. RESPIRATORY CULTURE REPORT TO FOLLOW.   Report Status 02/19/2013 FINAL   Final  CULTURE, RESPIRATORY (NON-EXPECTORATED)     Status: None   Collection Time    02/19/13  5:35 AM  Result Value Range Status   Specimen Description SPUTUM   Final   Special Requests ADDED 0620   Final   Gram Stain     Final   Value: FEW WBC PRESENT, PREDOMINANTLY PMN     NO SQUAMOUS EPITHELIAL CELLS SEEN     FEW GRAM POSITIVE RODS     RARE GRAM POSITIVE COCCI     IN PAIRS   Culture NORMAL OROPHARYNGEAL FLORA   Final   Report Status  02/21/2013 FINAL   Final     Labs: Basic Metabolic Panel:  Recent Labs Lab 02/17/13 1251 02/18/13 1840 02/19/13 0440 02/20/13 0538 02/21/13 0514  NA 139 138 139 140 136  K 4.9 3.5 3.2* 3.9 4.1  CL 102 98 99 98 95*  CO2 31 30 32 34* 34*  GLUCOSE 94 122* 86 96 154*  BUN 27* 29* 27* 29* 32*  CREATININE 1.9* 1.96* 1.81* 1.99* 1.90*  CALCIUM 9.4 9.4 9.2 9.1 9.3   Liver Function Tests:  Recent Labs Lab 02/18/13 1852  AST 19  ALT 7  ALKPHOS 94  BILITOT 0.5  PROT 7.5  ALBUMIN 4.0    Recent Labs Lab 02/18/13 1852  LIPASE 14   No results found for this basename: AMMONIA,  in the last 168 hours CBC:  Recent Labs Lab 02/18/13 1840 02/19/13 0440 02/20/13 0538  WBC 9.5 7.0 7.0  NEUTROABS  --  4.7  --   HGB 13.0 12.1* 12.0*  HCT 39.9 38.2* 38.3*  MCV 95.9 96.7 97.0  PLT 223 196 212   Cardiac Enzymes:  Recent Labs Lab 02/19/13 0440  TROPONINI <0.30   BNP: BNP (last 3 results)  Recent Labs  02/17/13 1251 02/18/13 1852  PROBNP 296.0* 1508.0*   CBG: No results found for this basename: GLUCAP,  in the last 168 hours     Signed:  Kela Millin  [facility] 02/21/2013, 12:31 PM

## 2013-02-21 NOTE — Progress Notes (Signed)
The patient's oxygen fell off and he desated to the low 80s.  His O2 sat quickly recovered to the mid-90s when put back on 3L of O2.

## 2013-02-21 NOTE — Care Management Note (Addendum)
  Page 2 of 2   02/21/2013     3:10:49 PM   CARE MANAGEMENT NOTE 02/21/2013  Patient:  [name], [name]   Account Number:  [id number]  Date Initiated:  02/21/2013  Documentation initiated by:  Oletta Cohn  Subjective/Objective Assessment:   79 yo male lives at home independently comes in with several days of uri symptoms, cough, fever, chills, sob. Temp 100.7 earlier at home.     Action/Plan:   Place on levaquin, nebs prn.  Continue his lasix dosing recently changed and increased by cardiology.//home with Home Health PT   Anticipated DC Date:  02/21/2013   Anticipated DC Plan:  HOME W HOME HEALTH SERVICES      DC Planning Services  CM consult      [facility] [facility] Choice  HOME HEALTH  DURABLE MEDICAL EQUIPMENT   Choice offered to / List presented to:  C-1 Patient   DME arranged  [facility]YGEN      DME agency  [facility]     HH arranged  HH-2 PT      [facility] agency  [facility]   Status of service:  Completed, signed off Medicare Important Message given?   (If response is "NO", the following Medicare IM given date fields will be blank) Date Medicare IM given:   Date Additional Medicare IM given:    Discharge Disposition:    Per UR Regulation:  Reviewed for med. necessity/level of care/duration of stay  If discussed at Long Length of Stay Meetings, dates discussed:    Comments:  02/21/13 @1045 .Marland KitchenMarland KitchenOletta Cohn, RN, BSN, NCM Spoke with pt at bedside regarding discharge planning needs for Home Health Physical Therapy.  Offer pt list of Home Health Agencies.  Pt chose [facility] to render services.  Kizzie Furnish of [facility] notified.  Pt lives at [facility].  DME needs identified at this time include Oxygen.

## 2013-02-22 NOTE — Progress Notes (Signed)
Patient's discharge information was reviewed with patient and given to patient for discharge back to [facility].  Grandson came to get patient and transport him to his apartment.  Patient verbalized understanding of discharge information.  IV and telemetry was d/c earlier.

## 2013-02-23 ENCOUNTER — Telehealth: Payer: Self-pay | Admitting: Cardiology

## 2013-02-23 NOTE — Telephone Encounter (Signed)
New Problem  Pt is calling regarding some lab work that was done.

## 2013-02-23 NOTE — Telephone Encounter (Signed)
Spoke with patient who states he cannot get to Dr. Antoine Poche and he is supposed to be his cardiologist.  Patient states he does not want to wait until September to see him. Patient states he does not want to see a PA. I discussed patient with Rocco Serene, RN who was able to work the patient in on 7/24 @ 0945.  Patient verbalized understanding of appointment date and time and that this is a work-in appointment therefore he may have to wait a little longer to see Dr. Antoine Poche on that day.  Patient thanked [place] for our help.

## 2013-02-23 NOTE — Telephone Encounter (Signed)
New problem    Pt wants to be seen within 1 wk due to hospital discharge-informed pt I could not find appt within that time frame w/dr hochrein and he did not want to see a PA

## 2013-02-25 LAB — CULTURE, BLOOD (ROUTINE X 2)

## 2013-02-27 NOTE — Addendum Note (Signed)
Addended by: Reesa Chew on: 02/27/2013 11:58 AM   Modules accepted: Orders

## 2013-02-28 ENCOUNTER — Ambulatory Visit (INDEPENDENT_AMBULATORY_CARE_PROVIDER_SITE_OTHER): Payer: Medicare Other | Admitting: Internal Medicine

## 2013-02-28 ENCOUNTER — Encounter: Payer: Self-pay | Admitting: Internal Medicine

## 2013-02-28 ENCOUNTER — Ambulatory Visit (INDEPENDENT_AMBULATORY_CARE_PROVIDER_SITE_OTHER)
Admission: RE | Admit: 2013-02-28 | Discharge: 2013-02-28 | Disposition: A | Discharge status: Home / Self Care | Payer: Medicare Other | Source: Ambulatory Visit | Attending: Internal Medicine | Admitting: Internal Medicine

## 2013-02-28 VITALS — BP 116/78 | HR 67 | Temp 98.1°F | Ht 67.5 in | Wt 191.0 lb

## 2013-02-28 DIAGNOSIS — J9610 Chronic respiratory failure, unspecified whether with hypoxia or hypercapnia: Secondary | ICD-10-CM

## 2013-02-28 DIAGNOSIS — J189 Pneumonia, unspecified organism: Secondary | ICD-10-CM

## 2013-02-28 DIAGNOSIS — J449 Chronic obstructive pulmonary disease, unspecified: Secondary | ICD-10-CM

## 2013-02-28 DIAGNOSIS — R061 Stridor: Secondary | ICD-10-CM

## 2013-02-28 NOTE — Patient Instructions (Addendum)
Please remember to go to the  x-ray department downstairs for your tests - we will call you with the results when they are available.  Omeprazole Take 30- 60 min before your first and last meals of the day and ranitidine 150 2 at bedtime  GERD (REFLUX)  is an extremely common cause of respiratory symptoms, many times with no significant heartburn at all.    It can be treated with medication, but also with lifestyle changes including avoidance of late meals, excessive alcohol, smoking cessation, and avoid fatty foods, chocolate, peppermint, colas, red wine, and acidic juices such as orange juice.  NO MINT OR MENTHOL PRODUCTS SO NO COUGH DROPS  USE SUGARLESS CANDY INSTEAD (jolley ranchers or Stover's)  NO OIL BASED VITAMINS - use powdered substitutes.    If not satisfied with your breathing See Tammy NP  with all your medications, even over the counter meds, separated in two separate bags, the ones you take no matter what vs the ones you stop once you feel better and take only as needed when you feel you need them.   Tammy  will generate for you a new user friendly medication calendar that will put [place] all on the same page re: your medication use.     Without this process, it simply isn't possible to assure that we are providing  your outpatient care  with  the attention to detail we feel you deserve.   If we cannot assure that you're getting that kind of care,  then we cannot manage your problem effectively from this clinic.  Once you have seen Tammy and we are sure that we're all on the same page with your medication use she will arrange follow up with me.

## 2013-02-28 NOTE — Progress Notes (Signed)
Subjective:    Patient ID: [name], male    DOB: 1918-11-10  MRN: [id number]    Brief patient profile:  89 yowm quit smoking in 1983 s/p LLLobe bx by [name] and recovered completely referred to pulmonary clinic 08/25/2012 by Dr [name] with pnds x around 2000 assoc with sob since 06/2012 with only GOLD I copd criteria 03/2012   HPI 08/25/2012 1st pulmonary ov/ [name] cc persistent year round pnds x over 10 years now sensation of something stuck in his throat x around 06/2012 prednisone makes it better, on prilosec at hs and zantac q am, no purulent sputum. Not better with albuterol. rec Omeprazole 40 mg Take 30-60 min before first meal of the day and Zantac(rantidine) 300mg  at bedtime for now GERD diet  Please see patient coordinator before you leave today  to schedule ENT evaluation  > neg mechanical obst per [name]  Admit date: 02/18/2013  Discharge date: 02/21/2013   Recommendations for Outpatient Follow-up:  Dr. Antoine Foster next week- call for appointment upon discharge  Dr. Clovis Foster in one to 2 weeks, call for appointment upon the  Pulmonology, call for appointment upon discharge  Follow up labs  Bmet to follow up on potassium and creatinine  Discharge Diagnoses:  Principal Problem:  PNA (pneumonia)  Active Problems:  COPD (chronic obstructive pulmonary disease)  SOB (shortness of breath)  CHF (congestive heart failure)  Discharge Condition: Improved/stable  Diet recommendation: Heart healthy  Filed Weights    02/18/13 2358  02/20/13 0702  02/21/13 0644   Weight:  84.097 kg (185 lb 6.4 oz)  83 kg (182 lb 15.7 oz)  83.5 kg (184 lb 1.4 oz)   History of present illness:  79 yo male lives at home independently comes in with several days of uri symptoms, cough, fever, chills, sob. Temp 100.7 earlier at home. No n/v. No le edema or swelling. No cp. Pt has recently been evaluated by his cardiologist also as outpt as was diagnosed with worsening chf and has echo scheduled in the next  week, his lasix dosing was also increased by cardiologist. These respiratory symptoms started after those changes. No recent abx use. Pt also with general malaise and body aches. No dysuria.  Hospital Course:  Active Problems  Acute Bronchitis with COPD (chronic obstructive pulmonary disease)  -As discussed above, upon patient patient was placed on nebulized bronchodilators and antibiotics and supplemental. On followup steroids were added. He was maintained on antitussives/mucolytics as well and he improved with above interventions. He was assess for home O2 and his O2 sats dropped to 87-88% on room air. To be discharged on nasal canula oxygen 2 L and is to follow up outpatient. He discharged on oral prednisone, bronchodilators and antibiotics  -Patient had a chest x-ray done which showed mildly progressive chronic interstitial lung disease, stable borderline cardiomegaly. It is noted that patient has seen Dr. Sherene Foster in the past, his to followup with pulmonary outpatient  Acute on chronic diastolic CHF (congestive heart failure)  -As discussed above, patient had been seen by cardiology on the of admission and his Lasix was adjusted.upon admission he had a 2-D echo which showed an EF of 55-60% with normal wall motion. He was diuresed with IV Lasix initially and then changed to po lasix c and dosed as recommended per cards prior to admission. He is clinically improved and is to follow up outpatient with cardiology  chronic interstitial lung disease- per CXR  -continue bronchodilators and supplemental o2  -follow up  outpt with pulm  Hypokalemia  -resolved, potassium was replaced.  Consultants:  none Procedures:  Echo Study Conclusions  Left ventricle: The cavity size was normal. Wall thickness was normal. Systolic function was normal. The estimated ejection fraction was in the range of 55% to 60%. Wall motion was normal; there were no regional wall motion abnormalities.   02/28/2013  Post hosp  ov/[name] re 02 24 /day (new) 2lpm does ok walking cafeteria and back at [facility] Chief Complaint  Patient presents with  . HFU    Pt states breathing is some better since d/c, but not bacl to baseline. Not coughing much, but when he does it is prod with minimal white sputum.     ? Better on symbicort since discharge.  No obvious daytime variabilty or   cp or chest tightness, subjective wheeze overt  hb symptoms. No unusual exp hx or h/o childhood pna/ asthma or premature birth to his knowledge.   Sleeping ok without nocturnal  or early am exacerbation  of respiratory  c/o's or need for noct saba. Also denies any obvious fluctuation of symptoms with weather or environmental changes or other aggravating or alleviating factors except as outlined above   Current Medications, Allergies, Past Medical History, Past Surgical History, Family History, and Social History were reviewed in [name] record.  ROS  The following are not active complaints unless bolded sore throat, dysphagia, dental problems, itching, sneezing,  nasal congestion or excess/ purulent secretions, ear ache,   fever, chills, sweats, unintended wt loss, pleuritic or exertional cp, hemoptysis,  orthopnea pnd or leg swelling, presyncope, palpitations, heartburn, abdominal pain, anorexia, nausea, vomiting, diarrhea  or change in bowel or urinary habits, change in stools or urine, dysuria,hematuria,  rash, arthralgias, visual complaints, headache, numbness weakness or ataxia or problems with walking or coordination,  change in mood/affect or memory.       .        Objective:   Physical Exam  amb wm nad with mild insp stridor  Wt Readings from Last 3 Encounters:  02/28/13 191 lb (86.637 kg)  02/21/13 184 lb 1.4 oz (83.5 kg)  02/17/13 197 lb (89.359 kg)     HEENT mild turbinate edema.  Oropharynx no thrush or excess pnd or cobblestoning.  No JVD or cervical adenopathy. Mild accessory muscle hypertrophy.  Trachea midline, nl thryroid. Chest was hyperinflated by percussion with diminished breath sounds and moderate increased exp time without wheeze. Hoover sign positive at mid inspiration. Regular rate and rhythm without murmur gallop or rub or increase P2 or edema.  Abd: no hsm, nl excursion. Ext warm without cyanosis or clubbing.     CXR  02/28/2013 :   1. Chronic interstitial lung disease without acute or superimposed abnormality.          Assessment & Plan:   Outpatient Encounter Prescriptions as of 02/28/2013  Medication Sig Dispense Refill  . acetaminophen (TYLENOL) 500 MG tablet Take 500 mg by mouth every 6 (six) hours as needed for pain.       Marland Kitchen allopurinol (ZYLOPRIM) 300 MG tablet Take 300 mg by mouth every morning.       Marland Kitchen amLODipine (NORVASC) 10 MG tablet Take 5 mg by mouth daily.      Marland Kitchen aspirin 81 MG tablet Take 81 mg by mouth every morning.       . benzonatate (TESSALON) 200 MG capsule Take 1 capsule (200 mg total) by mouth 3 (three) times daily as needed for  cough.  30 capsule  0  . budesonide-formoterol (SYMBICORT) 160-4.5 MCG/ACT inhaler Inhale 2 puffs into the lungs 2 (two) times daily.  1 Inhaler  12  . cholecalciferol (VITAMIN D) 1000 UNITS tablet Take 1,000 Units by mouth every morning.      Marland Kitchen dextromethorphan-guaiFENesin (MUCINEX DM) 30-600 MG per 12 hr tablet Take 2 tablets by mouth 2 (two) times daily.  30 tablet  0  . fluticasone (FLONASE) 50 MCG/ACT nasal spray Place 2 sprays into the nose daily.  16 g  0  . furosemide (LASIX) 40 MG tablet Take 1.5 tablets (60 mg total) by mouth See admin instructions.  45 tablet  0  . HYDROcodone-acetaminophen (NORCO/VICODIN) 5-325 MG per tablet Take 1 tablet by mouth every 6 (six) hours as needed for pain.      Marland Kitchen levalbuterol (XOPENEX HFA) 45 MCG/ACT inhaler Inhale 2 puffs into the lungs every 4 (four) hours as needed for wheezing.  1 Inhaler  0  . metoprolol succinate (TOPROL-XL) 25 MG 24 hr tablet Take 12.5 mg by mouth daily.       . Multiple Vitamins-Minerals (ICAPS AREDS FORMULA PO) Take 1 capsule by mouth 2 (two) times daily.        . nitroGLYCERIN (NITROSTAT) 0.4 MG SL tablet Place 0.4 mg under the tongue every 5 (five) minutes as needed for chest pain.      Marland Kitchen omeprazole (PRILOSEC) 20 MG capsule Take 20 mg by mouth 2 (two) times daily.       . ranitidine (ZANTAC) 150 MG tablet Take 150 mg by mouth 2 (two) times daily.        Marland Kitchen terazosin (HYTRIN) 5 MG capsule Take 5 mg by mouth at bedtime.        . [DISCONTINUED] levofloxacin (LEVAQUIN) 750 MG tablet Take 1 tablet (750 mg total) by mouth every other day.  2 tablet  0  . [DISCONTINUED] predniSONE (DELTASONE) 20 MG tablet Take 2 tablets (40 mg total) by mouth daily with breakfast.  10 tablet  0   No facility-administered encounter medications on file as of 02/28/2013.

## 2013-03-01 ENCOUNTER — Telehealth: Payer: Self-pay | Admitting: Internal Medicine

## 2013-03-01 NOTE — Telephone Encounter (Signed)
Spoke with patient,  Patient states someone from [place] called his phone I do not see anything documented--Epic states [name] already spoke with patient regarding cxr Patient stated he has appt with Dr. Antoine Poche tomorrow and I informed patient that it might have been an appt reminder Patient requesting to be transferred to Dr. Jenene Slicker office-- I have transferred patient and nothing further needed at this time

## 2013-03-01 NOTE — Assessment & Plan Note (Addendum)
-   Referred to ENT [facility])  09/27/12 > some air secretions but no mechanical obstruction  rx as gerd with max rx     Each maintenance medication was reviewed in detail including most importantly the difference between maintenance and as needed and under what circumstances the prns are to be used.  Please see instructions for details which were reviewed in writing and the patient given a copy.    My sense is that he is beginning to struggle with concept of med reconciliation with so many sources of care and have offered the services of our NP if he desires but no plan to f/u regularly in this clinic unless we do this as a first step:   To keep things simple, I have asked the patient to first separate medicines that are perceived as maintenance, that is to be taken daily "no matter what", from those medicines that are taken on only on an as-needed basis and I have given the patient examples of both, and then return to see our NP to generate a  detailed  medication calendar which should be followed until the next physician sees the patient and updates it.

## 2013-03-01 NOTE — Progress Notes (Signed)
Quick Note:  Spoke with pt and notified of results per Dr. [name]. Pt verbalized understanding and denied any questions.  ______ 

## 2013-03-01 NOTE — Assessment & Plan Note (Signed)
-   placed on 02 at discharge from from [facility]  02/21/13   Adequate control on present rx, reviewed > no change rx for now

## 2013-03-01 NOTE — Assessment & Plan Note (Signed)
-   PFT's 04/08/12 FEV1 1.63 (77%) ratio 69 DLCO 75%  He is barely a GOLD 1 and probably doesn't need treatment unless exac with ab component > no change rx for now

## 2013-03-02 ENCOUNTER — Ambulatory Visit (INDEPENDENT_AMBULATORY_CARE_PROVIDER_SITE_OTHER): Payer: Medicare Other | Admitting: Cardiology

## 2013-03-02 ENCOUNTER — Encounter: Payer: Self-pay | Admitting: Cardiology

## 2013-03-02 VITALS — BP 128/72 | HR 72 | Ht 68.0 in | Wt 187.8 lb

## 2013-03-02 DIAGNOSIS — I519 Heart disease, unspecified: Secondary | ICD-10-CM

## 2013-03-02 DIAGNOSIS — R0602 Shortness of breath: Secondary | ICD-10-CM

## 2013-03-02 LAB — BASIC METABOLIC PANEL
BUN: 37 mg/dL — ABNORMAL HIGH (ref 6–23)
Calcium: 9 mg/dL (ref 8.4–10.5)
Creatinine, Ser: 1.8 mg/dL — ABNORMAL HIGH (ref 0.4–1.5)
GFR: 37.47 mL/min — ABNORMAL LOW (ref >60.00)
Glucose, Bld: 90 mg/dL (ref 70–99)
Potassium: 3.5 mEq/L (ref 3.5–5.1)

## 2013-03-02 NOTE — Patient Instructions (Addendum)
The current medical regimen is effective;  continue present plan and medications.  Please have blood work today  (BMP)  Follow up in 4 months with Dr Antoine Poche.  You will receive a letter in the mail 2 months before you are due.  Please call us when you receive this letter to schedule your follow up appointment.

## 2013-03-02 NOTE — Progress Notes (Signed)
HPI The patient presents for follow up of CAD.  He was hospitalized a few days ago with pneumonia. He had some acute diastolic dysfunction as well. He was treated with IV diuresis.  He's on a higher dose of diuretic now. He says he's actually recovered from the acute symptoms but still very weak. He's using his portable oxygen. He does have some cough productive of a thick white or yellow sputum. He saw Dr. Sherene Sires recently and had no acute findings. He's not having any new chest pressure, neck or arm discomfort. He's not having any palpitations, presyncope or syncope.  I reviewed all available hospital records.  Allergies  Allergen Reactions  . Penicillins Hives and Rash    Current Outpatient Prescriptions  Medication Sig Dispense Refill  . acetaminophen (TYLENOL) 500 MG tablet Take 500 mg by mouth every 6 (six) hours as needed for pain.       Marland Kitchen allopurinol (ZYLOPRIM) 300 MG tablet Take 300 mg by mouth every morning.       Marland Kitchen amLODipine (NORVASC) 10 MG tablet Take 5 mg by mouth daily.      Marland Kitchen aspirin 81 MG tablet Take 81 mg by mouth every morning.       . benzonatate (TESSALON) 200 MG capsule Take 1 capsule (200 mg total) by mouth 3 (three) times daily as needed for cough.  30 capsule  0  . budesonide-formoterol (SYMBICORT) 160-4.5 MCG/ACT inhaler Inhale 2 puffs into the lungs 2 (two) times daily.  1 Inhaler  12  . cholecalciferol (VITAMIN D) 1000 UNITS tablet Take 1,000 Units by mouth every morning.      Marland Kitchen dextromethorphan-guaiFENesin (MUCINEX DM) 30-600 MG per 12 hr tablet Take 2 tablets by mouth 2 (two) times daily.  30 tablet  0  . fluticasone (FLONASE) 50 MCG/ACT nasal spray Place 2 sprays into the nose daily.  16 g  0  . furosemide (LASIX) 40 MG tablet Take 1.5 tablets (60 mg total) by mouth See admin instructions.  45 tablet  0  . HYDROcodone-acetaminophen (NORCO/VICODIN) 5-325 MG per tablet Take 1 tablet by mouth every 6 (six) hours as needed for pain.      Marland Kitchen levalbuterol (XOPENEX HFA)  45 MCG/ACT inhaler Inhale 2 puffs into the lungs every 4 (four) hours as needed for wheezing.  1 Inhaler  0  . metoprolol succinate (TOPROL-XL) 25 MG 24 hr tablet Take 12.5 mg by mouth daily.      . Multiple Vitamins-Minerals (ICAPS AREDS FORMULA PO) Take 1 capsule by mouth 2 (two) times daily.        . nitroGLYCERIN (NITROSTAT) 0.4 MG SL tablet Place 0.4 mg under the tongue every 5 (five) minutes as needed for chest pain.      Marland Kitchen omeprazole (PRILOSEC) 20 MG capsule Take 20 mg by mouth 2 (two) times daily.       . ranitidine (ZANTAC) 150 MG tablet Take 150 mg by mouth 2 (two) times daily.        Marland Kitchen terazosin (HYTRIN) 5 MG capsule Take 5 mg by mouth at bedtime.         No current facility-administered medications for this visit.    Past Medical History  Diagnosis Date  . Vertigo   . Lymphoma   . Hypertension   . GERD (gastroesophageal reflux disease)   . Gout   . RAS (renal artery stenosis)     PCI left renal artery 2005  . Coronary artery disease     a.  s/p stent to RCA in 2000;  b. Last CLite 4/07: no ichemia, EF 73%  . CKD (chronic kidney disease) stage 3, GFR 30-59 ml/min     Past Surgical History  Procedure Laterality Date  . Stent to the righ coronary in 2000    . Lung surgery    . Coronary angioplasty with stent placement    . Appendectomy    . Tonsillectomy    . Hemorroidectomy    . Cataract extraction     ROS: As stated in the HPI and negative for all other systems.  PHYSICAL EXAM BP 128/72  Pulse 72  Ht 5\' 8"  (1.727 m)  Wt 187 lb 12.8 oz (85.186 kg)  BMI 28.56 kg/m2 GENERAL:  Well appearing for his age HEENT:  Pupils equal round and reactive, fundi not visualized, oral mucosa unremarkable NECK:  No jugular venous distention, waveform within normal limits, carotid upstroke brisk and symmetric, no bruits, no thyromegaly LYMPHATICS:  No cervical, inguinal adenopathy LUNGS:  Clear to auscultation bilaterally BACK:  No CVA tenderness CHEST:  Unremarkable HEART:   PMI not displaced or sustained,S1 and S2 within normal limits, no S3, no S4, no clicks, no rubs, no murmurs ABD:  Flat, positive bowel sounds normal in frequency in pitch, no bruits, no rebound, no guarding, no midline pulsatile mass, no hepatomegaly, no splenomegaly EXT:  2 plus pulses throughout, no edema, no cyanosis no clubbing, arthritic changes, trace edema  EKG:  Sinus rhythm, rate 72, axis within normal limits, intervals within normal limits, no acute ST-T wave changes.  03/02/2013  ASSESSMENT AND PLAN  CAD: The patient has no new sypmtoms.  No further cardiovascular testing is indicated.  We will continue with aggressive risk reduction and meds as listed.  HTN:  The blood pressure is at target. No change in medications is indicated. We will continue with therapeutic lifestyle changes (TLC).  CKD:  I will check a BMET.    CHF:  He had diastolic heart failure exacerbation in the hospital and is on a higher dose of diuretic. I will keep this going but will check blood work as above.

## 2013-03-03 ENCOUNTER — Telehealth: Payer: Self-pay | Admitting: Internal Medicine

## 2013-03-03 DIAGNOSIS — J449 Chronic obstructive pulmonary disease, unspecified: Secondary | ICD-10-CM

## 2013-03-03 NOTE — Telephone Encounter (Signed)
Called and spoke with [name] and she is aware ok for humidifier for the pts oxygen.  Order has been placed in the system and i have sent email to [facility] to make her aware.

## 2013-05-01 ENCOUNTER — Emergency Department (HOSPITAL_COMMUNITY): Payer: Medicare Other

## 2013-05-01 ENCOUNTER — Encounter (HOSPITAL_COMMUNITY): Payer: Self-pay | Admitting: Emergency Medicine

## 2013-05-01 ENCOUNTER — Observation Stay (HOSPITAL_COMMUNITY)
Admission: EM | Admit: 2013-05-01 | Discharge: 2013-05-03 | Disposition: A | Discharge status: Nursing Home (Includes ICF) | Payer: Medicare Other | Attending: Internal Medicine | Admitting: Internal Medicine

## 2013-05-01 DIAGNOSIS — J9610 Chronic respiratory failure, unspecified whether with hypoxia or hypercapnia: Secondary | ICD-10-CM

## 2013-05-01 DIAGNOSIS — R55 Syncope and collapse: Secondary | ICD-10-CM

## 2013-05-01 DIAGNOSIS — I129 Hypertensive chronic kidney disease with stage 1 through stage 4 chronic kidney disease, or unspecified chronic kidney disease: Secondary | ICD-10-CM | POA: Insufficient documentation

## 2013-05-01 DIAGNOSIS — N183 Chronic kidney disease, stage 3 unspecified: Secondary | ICD-10-CM | POA: Insufficient documentation

## 2013-05-01 DIAGNOSIS — M109 Gout, unspecified: Secondary | ICD-10-CM | POA: Insufficient documentation

## 2013-05-01 DIAGNOSIS — I10 Essential (primary) hypertension: Secondary | ICD-10-CM

## 2013-05-01 DIAGNOSIS — G319 Degenerative disease of nervous system, unspecified: Secondary | ICD-10-CM | POA: Insufficient documentation

## 2013-05-01 DIAGNOSIS — Z8673 Personal history of transient ischemic attack (TIA), and cerebral infarction without residual deficits: Secondary | ICD-10-CM | POA: Insufficient documentation

## 2013-05-01 DIAGNOSIS — R42 Dizziness and giddiness: Secondary | ICD-10-CM

## 2013-05-01 DIAGNOSIS — N400 Enlarged prostate without lower urinary tract symptoms: Secondary | ICD-10-CM | POA: Insufficient documentation

## 2013-05-01 DIAGNOSIS — I2510 Atherosclerotic heart disease of native coronary artery without angina pectoris: Secondary | ICD-10-CM | POA: Insufficient documentation

## 2013-05-01 DIAGNOSIS — F329 Major depressive disorder, single episode, unspecified: Secondary | ICD-10-CM | POA: Insufficient documentation

## 2013-05-01 DIAGNOSIS — J4489 Other specified chronic obstructive pulmonary disease: Secondary | ICD-10-CM | POA: Insufficient documentation

## 2013-05-01 DIAGNOSIS — F3289 Other specified depressive episodes: Secondary | ICD-10-CM | POA: Insufficient documentation

## 2013-05-01 DIAGNOSIS — W19XXXA Unspecified fall, initial encounter: Secondary | ICD-10-CM

## 2013-05-01 DIAGNOSIS — I519 Heart disease, unspecified: Secondary | ICD-10-CM | POA: Diagnosis present

## 2013-05-01 DIAGNOSIS — R296 Repeated falls: Secondary | ICD-10-CM | POA: Insufficient documentation

## 2013-05-01 DIAGNOSIS — J3489 Other specified disorders of nose and nasal sinuses: Secondary | ICD-10-CM | POA: Insufficient documentation

## 2013-05-01 DIAGNOSIS — J8410 Pulmonary fibrosis, unspecified: Secondary | ICD-10-CM | POA: Insufficient documentation

## 2013-05-01 DIAGNOSIS — J449 Chronic obstructive pulmonary disease, unspecified: Secondary | ICD-10-CM | POA: Diagnosis present

## 2013-05-01 DIAGNOSIS — Y92009 Unspecified place in unspecified non-institutional (private) residence as the place of occurrence of the external cause: Secondary | ICD-10-CM | POA: Insufficient documentation

## 2013-05-01 DIAGNOSIS — K219 Gastro-esophageal reflux disease without esophagitis: Secondary | ICD-10-CM | POA: Insufficient documentation

## 2013-05-01 DIAGNOSIS — H8110 Benign paroxysmal vertigo, unspecified ear: Principal | ICD-10-CM | POA: Insufficient documentation

## 2013-05-01 DIAGNOSIS — Z79899 Other long term (current) drug therapy: Secondary | ICD-10-CM | POA: Insufficient documentation

## 2013-05-01 LAB — BASIC METABOLIC PANEL
CO2: 25 mEq/L (ref 19–32)
Chloride: 100 mEq/L (ref 96–112)
Creatinine, Ser: 1.7 mg/dL — ABNORMAL HIGH (ref 0.50–1.35)
GFR calc Af Amer: 39 mL/min — ABNORMAL LOW (ref >90)
Sodium: 137 mEq/L (ref 135–145)

## 2013-05-01 LAB — URINALYSIS, ROUTINE W REFLEX MICROSCOPIC
Glucose, UA: NEGATIVE mg/dL
Ketones, ur: NEGATIVE mg/dL
Leukocytes, UA: NEGATIVE
Nitrite: NEGATIVE
Protein, ur: NEGATIVE mg/dL
pH: 5.5 (ref 5.0–8.0)

## 2013-05-01 LAB — CBC WITH DIFFERENTIAL/PLATELET
Basophils Absolute: 0 10*3/uL (ref 0.0–0.1)
Basophils Relative: 1 % (ref 0–1)
Lymphocytes Relative: 25 % (ref 12–46)
MCHC: 32.4 g/dL (ref 30.0–36.0)
Neutro Abs: 3.5 10*3/uL (ref 1.7–7.7)
Neutrophils Relative %: 62 % (ref 43–77)
Platelets: 175 10*3/uL (ref 150–400)
RDW: 18 % — ABNORMAL HIGH (ref 11.5–15.5)
WBC: 5.7 10*3/uL (ref 4.0–10.5)

## 2013-05-01 LAB — POCT I-STAT TROPONIN I

## 2013-05-01 NOTE — ED Notes (Signed)
Per EMS, pt independent living at [facility] home, having multiple episodes of syncope today, noted to be orthostatic and diaphoretic upon sitting from lying position.  Pt was incontinent after syncopal episode.  Pt noted to have abdominal distention.  Denies pain post fall to EMS.

## 2013-05-01 NOTE — ED Notes (Signed)
Bed: OZ30 Expected date:  Expected time:  Means of arrival:  Comments: Syncope/fall

## 2013-05-01 NOTE — H&P (Signed)
[facility] History and Physical  Arrian Manson Goslin ZOX:[id number] DOB: 1919/01/07 DOA: 05/01/2013  Referring physician: Dr. Boyce Medici PCP: Benita Stabile, MD   Chief Complaint: Follow at the assisted-living with dizziness  HPI:  79 year old pleasant male with history of CAD, hypertension, chronic kidney disease stage III, history of vertigo, history of renal artery stenosis who lives in an assisted living with his wife was brought to the ED after he sustained a fall at home on 2 occasions. Patient reports preparing supper this evening when he felt extremely dizzy with the room spinning around and slumped on the ground. He pressed the alarm for help and was helped to get up by the staff at the facility. He then wen up and then went to his study to put on the oxygen cannula when he again became very dizzy and felt the room spinning around again and slumped backwards and hit his back to the table and on the ground again and could not get up. He denies loss of consciousness. Denies any headache, blurred vision, nausea, vomiting, chest pain, palpitations, abdominal pain, bowel or urinary symptoms. Denies injury to his head but did sustain a laceration over his right arm. He reports having a history of vertigo in the past which was similar to this symptom and used to take meclizine for this. He denies any recent illness or having poor appetite. He denies any change in his medications recently.  Course in the ED Patient's vitals were stable. Orthostasis take in the ED was unremarkable however patient was consistently dizzy on sitting up or standing. Her head CT done was unremarkable pediatric chest x-ray was negative. Labs done were unremarkable. An EKG done was normal. [facility] called for admission given the patient's persistent dizziness. MRI ordered to rule out possible posterior circulation infarct.  Review of Systems:  Constitutional: Denies fever, chills, diaphoresis, appetite change  and fatigue.  HEENT: Denies photophobia, eye pain, redness, hearing loss, ear pain, congestion, sore throat, rhinorrhea, sneezing, mouth sores, trouble swallowing, neck pain, neck stiffness and tinnitus.   Respiratory: Denies SOB, DOE, cough, chest tightness,  and wheezing.   Cardiovascular: Denies chest pain, palpitations, has chronic leg swelling. Gastrointestinal: Denies nausea, vomiting, abdominal pain, diarrhea, constipation, blood in stool and abdominal distention.  Genitourinary: Denies dysuria, urgency,  hematuria, flank pain and difficulty urinating.  reports urgency and frequency Musculoskeletal: Denies myalgias, back pain, joint swelling, arthralgias and gait problem.  Skin: Denies pallor, rash and wound.  Neurological: Vertigo and dizziness Denies  , seizures, syncope,  numbness and headaches.  Psychiatric/Behavioral: Denies mood changes, confusion, nervousness, sleep disturbance and agitation   Past Medical History  Diagnosis Date  . Vertigo   . Lymphoma   . Hypertension   . GERD (gastroesophageal reflux disease)   . Gout   . RAS (renal artery stenosis)     PCI left renal artery 2005  . Coronary artery disease     a. s/p stent to RCA in 2000;  b. Last CLite 4/07: no ichemia, EF 73%  . CKD (chronic kidney disease) stage 3, GFR 30-59 ml/min    Past Surgical History  Procedure Laterality Date  . Stent to the righ coronary in 2000    . Lung surgery    . Coronary angioplasty with stent placement    . Appendectomy    . Tonsillectomy    . Hemorroidectomy    . Cataract extraction     Social History:  reports that he quit smoking about 32 years ago.  His smoking use included Cigarettes. He has a 25 pack-year smoking history. He has never used smokeless tobacco. He reports that he does not drink alcohol or use illicit drugs.  Allergies  Allergen Reactions  . Penicillins Hives and Rash    Family History  Problem Relation Age of Onset  . Lung cancer Brother     heavy  smoker  . Heart disease Father   . Rheum arthritis Mother     Prior to Admission medications   Medication Sig Start Date End Date Taking? Authorizing Provider  acetaminophen (TYLENOL) 500 MG tablet Take 500 mg by mouth every 6 (six) hours as needed for pain.    Yes Historical Provider, MD  allopurinol (ZYLOPRIM) 300 MG tablet Take 300 mg by mouth every morning.    Yes Historical Provider, MD  amLODipine (NORVASC) 10 MG tablet Take 5 mg by mouth daily.   Yes Historical Provider, MD  budesonide-formoterol (SYMBICORT) 160-4.5 MCG/ACT inhaler Inhale 2 puffs into the lungs 2 (two) times daily. 02/21/13  Yes Adeline Joselyn Glassman, MD  cholecalciferol (VITAMIN D) 1000 UNITS tablet Take 1,000 Units by mouth every morning.   Yes Historical Provider, MD  fluticasone (FLONASE) 50 MCG/ACT nasal spray Place 2 sprays into the nose daily. 02/21/13  Yes Adeline Joselyn Glassman, MD  furosemide (LASIX) 40 MG tablet Take 1.5 tablets (60 mg total) by mouth See admin instructions. 02/21/13  Yes Adeline Joselyn Glassman, MD  HYDROcodone-acetaminophen (NORCO/VICODIN) 5-325 MG per tablet Take 1 tablet by mouth every 6 (six) hours as needed for pain.   Yes Historical Provider, MD  levalbuterol [facility] HFA) 45 MCG/ACT inhaler Inhale 2 puffs into the lungs every 4 (four) hours as needed for wheezing. 02/21/13  Yes Adeline Joselyn Glassman, MD  metoprolol succinate (TOPROL-XL) 25 MG 24 hr tablet Take 12.5 mg by mouth daily.   Yes Historical Provider, MD  Multiple Vitamins-Minerals (ICAPS AREDS FORMULA PO) Take 1 capsule by mouth 2 (two) times daily.     Yes Historical Provider, MD  nitroGLYCERIN (NITROSTAT) 0.4 MG SL tablet Place 0.4 mg under the tongue every 5 (five) minutes as needed for chest pain. 05/27/11  Yes Rollene Rotunda, MD  omeprazole (PRILOSEC) 20 MG capsule Take 20 mg by mouth 2 (two) times daily.    Yes Historical Provider, MD  terazosin (HYTRIN) 5 MG capsule Take 5 mg by mouth at bedtime.     Yes Historical Provider, MD    Physical  Exam:  Filed Vitals:   05/01/13 1947 05/01/13 1948 05/01/13 1950 05/01/13 1952  BP: 114/63 114/63 109/66 119/66  Pulse: 71 73 76 80  Temp: 97.9 F (36.6 C)     SpO2: 98%       Constitutional: Vital signs reviewed.  Elderly male lying in bed in no acute distress HEENT: No pallor, no icterus, moist oral mucosa, horizontal nystagmus present on exam Cardiovascular: RRR, S1 normal, S2 normal, no MRG,  Pulmonary/Chest: CTAB, few scattered rhonchi bilaterally Abdominal: Soft. Non-tender, non-distended, bowel sounds are normal, no masses, organomegaly, or guarding present.  Ext: Warm, no edema, laceration over right triceps area Neurological: A&O x3, Strenght is normal and symmetric bilaterally, cranial nerve II-XII are grossly intact, no focal motor deficit, sensory intact to light touch bilaterally.  cerebellar function intact. Has horizontal nystagmus. Patient is symptomatic with dizziness on sitting up.   Labs on Admission:  Basic Metabolic Panel:  Recent Labs Lab 05/01/13 2050  NA 137  K 4.6  CL 100  CO2 25  GLUCOSE  95  BUN 25*  CREATININE 1.70*  CALCIUM 8.6   Liver Function Tests: No results found for this basename: AST, ALT, ALKPHOS, BILITOT, PROT, ALBUMIN,  in the last 168 hours No results found for this basename: LIPASE, AMYLASE,  in the last 168 hours No results found for this basename: AMMONIA,  in the last 168 hours CBC:  Recent Labs Lab 05/01/13 2050  WBC 5.7  NEUTROABS 3.5  HGB 12.1*  HCT 37.4*  MCV 98.4  PLT 175   Cardiac Enzymes: No results found for this basename: CKTOTAL, CKMB, CKMBINDEX, TROPONINI,  in the last 168 hours BNP: No components found with this basename: POCBNP,  CBG: No results found for this basename: GLUCAP,  in the last 168 hours  Radiological Exams on Admission: Dg Chest 2 View  05/01/2013   CLINICAL DATA:  Loss of consciousness. Weakness. Short of breath.  EXAM: CHEST  2 VIEW  COMPARISON:  02/28/2013.  FINDINGS: Cardiomegaly and  chronic interstitial lung disease appears similar to the prior examination. Monitoring leads project over the chest. Electronic device projects over the right upper quadrant of the abdomen which is new compared to the prior exam. This appears to be external to the patient on the lateral view. Aortic arch atherosclerosis. There is pulmonary vascular congestion compared to prior exam and the findings are compatible with chronic changes with superimposed mild CHF.  IMPRESSION: Chronic changes of the chest with superimposed pulmonary vascular congestion. Superimposed interstitial pulmonary edema may be present but is indistinguishable from the background of increased interstitial markings from interstitial lung disease.   Electronically Signed   By: Andreas Newport M.D.   On: 05/01/2013 20:47   Ct Head Wo Contrast  05/01/2013   CLINICAL DATA:  Dizziness. Syncopal episode.  EXAM: CT HEAD WITHOUT CONTRAST  TECHNIQUE: Contiguous axial images were obtained from the base of the skull through the vertex without intravenous contrast.  COMPARISON:  Head CT 08/22/2010.  FINDINGS: There is chronic atrophy with minimally progressive periventricular and subcortical white matter disease bilaterally. No cortical based infarct is identified. There is no evidence of acute intracranial hemorrhage, mass lesion, brain edema or extra-axial fluid collection.  Left maxillary sinus mucous retention cyst is noted. The additional visualized paranasal sinuses, mastoid air cells and middle ears are clear. The calvarium is intact.  IMPRESSION: Mildly progressive atrophy and chronic small vessel ischemic changes. No acute intracranial findings identified.   Electronically Signed   By: Roxy Horseman   On: 05/01/2013 20:43    EKG: NSR@72 , no ST-T changes. Poor quality EKG . Ordered repeat  Assessment/Plan Principal Problem:   BPPV (benign paroxysmal positional vertigo) Symptoms are quite typical of low BPPV. Head CT negative. Orthostasis  negative. No EKG changes. However given acute onset lead to episodes off all MCV of dizziness we will get an MRI of the brain to rule out for any posterior circulation infarct. -Monitor in telemetry. Patient gets quite symptomatic even on sitting up. I will start him on scheduled meclizine 12.5 mg 3 times a day and titrate dose up as needed. -Bedrest for now with physical therapy evaluation in the morning. I will hold off on his amlodipine.  Active Problems: CAD Stable. Follows with Dr. Antoine Poche. Continue metoprolol    Hypertension Depression stable. Continue metoprolol and Lasix. Avoiding amlodipine given symptoms of dizziness.    COPD  Has chronic interstitial lung disease on imaging. Was admitted 2 months back for acute bronchitis. On home oxygen started recently which is continued. Continue  Xopenex. He does have mild wheezing and I will place him on albuterol nebs.    CKD (chronic kidney disease) stage 3, GFR 30-59 ml/min Stable. Follows with Dr Abel Presto  BPH Continue terazosin   DVT prophylaxis: Subcutaneous heparin Diet: Cardiac   Code Status: Full code Family Communication: Son at bedside Disposition Plan: Return to assist living once symptoms improve  Eddie North [facility] Pager [phone]  If 7PM-7AM, please contact night-coverage [email or web address] Password [facility] 05/01/2013, 11:41 PM    Total time spent on admission: 55 minutes

## 2013-05-01 NOTE — ED Provider Notes (Signed)
CSN: [id number]     Arrival date & time 05/01/13  1932 History   First MD Initiated Contact with Patient 05/01/13 1947     Chief Complaint  Patient presents with  . Fall   (Consider location/radiation/quality/duration/timing/severity/associated sxs/prior Treatment) HPI [name] with history of vertigo, hypertension, coronary artery disease, and renal artery stenosis who presents with syncope. Patient lives in an independent living facility. He was found earlier by CNA on the floor. The patient endorsing to be and dizziness to EMS and the nurse. He states to me that he remembers falling but couldn't catch himself. He denies any room spinning dizziness.  Patient denies any headache or head trauma. He denies any neck pain. He did have a skin tear to his right upper extremity. Per the patient's son, it was reported to him earlier that the patient endorses having fallen multiple times in the last 10 days. He denies any chest pain. He does endorse baseline shortness of breath since he had pneumonia in July. He is on supplemental O2 at home. He denies any focal weakness or numbness. Past Medical History  Diagnosis Date  . Vertigo   . Lymphoma   . Hypertension   . GERD (gastroesophageal reflux disease)   . Gout   . RAS (renal artery stenosis)     PCI left renal artery 2005  . Coronary artery disease     a. s/p stent to RCA in 2000;  b. Last CLite 4/07: no ichemia, EF 73%  . CKD (chronic kidney disease) stage 3, GFR 30-59 ml/min    Past Surgical History  Procedure Laterality Date  . Stent to the righ coronary in 2000    . Lung surgery    . Coronary angioplasty with stent placement    . Appendectomy    . Tonsillectomy    . Hemorroidectomy    . Cataract extraction     Family History  Problem Relation Age of Onset  . Lung cancer Brother     heavy smoker  . Heart disease Father   . Rheum arthritis Mother    History  Substance Use Topics  . Smoking status: Former Smoker  -- 0.50 packs/day for 50 years    Types: Cigarettes    Quit date: 05/03/1981  . Smokeless tobacco: Never Used     Comment: "did not inhale"  . Alcohol Use: No    Review of Systems  Constitutional: Negative.  Negative for fever.  HENT: Negative for neck pain.   Eyes: Negative for photophobia and visual disturbance.  Respiratory: Positive for shortness of breath. Negative for chest tightness.   Cardiovascular: Negative.  Negative for chest pain.  Gastrointestinal: Negative.  Negative for nausea, vomiting and abdominal pain.  Genitourinary:       Urinary incontinence  Musculoskeletal: Negative for gait problem.  Skin: Negative for rash.  Neurological: Positive for syncope and headaches. Negative for weakness.  Psychiatric/Behavioral: Negative for confusion.  All other systems reviewed and are negative.    Allergies  Penicillins  Home Medications   Current Outpatient Rx  Name  Route  Sig  Dispense  Refill  . acetaminophen (TYLENOL) 500 MG tablet   Oral   Take 500 mg by mouth every 6 (six) hours as needed for pain.          Marland Kitchen allopurinol (ZYLOPRIM) 300 MG tablet   Oral   Take 300 mg by mouth every morning.          Marland Kitchen amLODipine (  NORVASC) 10 MG tablet   Oral   Take 5 mg by mouth daily.         . budesonide-formoterol (SYMBICORT) 160-4.5 MCG/ACT inhaler   Inhalation   Inhale 2 puffs into the lungs 2 (two) times daily.   1 Inhaler   12   . cholecalciferol (VITAMIN D) 1000 UNITS tablet   Oral   Take 1,000 Units by mouth every morning.         . fluticasone (FLONASE) 50 MCG/ACT nasal spray   Nasal   Place 2 sprays into the nose daily.   16 g   0   . furosemide (LASIX) 40 MG tablet   Oral   Take 1.5 tablets (60 mg total) by mouth See admin instructions.   45 tablet   0   . HYDROcodone-acetaminophen (NORCO/VICODIN) 5-325 MG per tablet   Oral   Take 1 tablet by mouth every 6 (six) hours as needed for pain.         Marland Kitchen levalbuterol (XOPENEX HFA) 45  MCG/ACT inhaler   Inhalation   Inhale 2 puffs into the lungs every 4 (four) hours as needed for wheezing.   1 Inhaler   0   . metoprolol succinate (TOPROL-XL) 25 MG 24 hr tablet   Oral   Take 12.5 mg by mouth daily.         . Multiple Vitamins-Minerals (ICAPS AREDS FORMULA PO)   Oral   Take 1 capsule by mouth 2 (two) times daily.           . nitroGLYCERIN (NITROSTAT) 0.4 MG SL tablet   Sublingual   Place 0.4 mg under the tongue every 5 (five) minutes as needed for chest pain.         Marland Kitchen omeprazole (PRILOSEC) 20 MG capsule   Oral   Take 20 mg by mouth 2 (two) times daily.          Marland Kitchen terazosin (HYTRIN) 5 MG capsule   Oral   Take 5 mg by mouth at bedtime.            BP 119/66  Pulse 80  Temp(Src) 97.9 F (36.6 C)  SpO2 98% Physical Exam  Nursing note and vitals reviewed. Constitutional: He is oriented to person, place, and time. He appears well-developed and well-nourished. No distress.  HENT:  Head: Normocephalic and atraumatic.  Mouth/Throat: Oropharynx is clear and moist.  Eyes: Pupils are equal, round, and reactive to light.  Neck: Neck supple.  No midline C-spine tenderness  Cardiovascular: Normal rate, regular rhythm and normal heart sounds.   No murmur heard. Pulmonary/Chest: Effort normal and breath sounds normal. No respiratory distress. He has no wheezes.  Abdominal: Soft. Bowel sounds are normal. There is no tenderness. There is no rebound.  Musculoskeletal: Normal range of motion. He exhibits no edema.  Lymphadenopathy:    He has no cervical adenopathy.  Neurological: He is alert and oriented to person, place, and time. No cranial nerve deficit. Coordination normal.  Coordination intact to finger-nose-finger, no nystagmus noted, normal reflexes, 5 out of 5 strength in all 4 extremities  Skin: Skin is warm and dry.  Large 8 x 4 cm skin tear over the right forearm  Psychiatric: He has a normal mood and affect.    ED Course  Procedures (including  critical care time) Labs Review Labs Reviewed  CBC WITH DIFFERENTIAL - Abnormal; Notable for the following:    RBC 3.80 (*)    Hemoglobin 12.1 (*)  HCT 37.4 (*)    RDW 18.0 (*)    All other components within normal limits  BASIC METABOLIC PANEL - Abnormal; Notable for the following:    BUN 25 (*)    Creatinine, Ser 1.70 (*)    GFR calc non Af Amer 33 (*)    GFR calc Af Amer 39 (*)    All other components within normal limits  URINALYSIS, ROUTINE W REFLEX MICROSCOPIC  POCT I-STAT TROPONIN I   Imaging Review Dg Chest 2 View  05/01/2013   CLINICAL DATA:  Loss of consciousness. Weakness. Short of breath.  EXAM: CHEST  2 VIEW  COMPARISON:  02/28/2013.  FINDINGS: Cardiomegaly and chronic interstitial lung disease appears similar to the prior examination. Monitoring leads project over the chest. Electronic device projects over the right upper quadrant of the abdomen which is new compared to the prior exam. This appears to be external to the patient on the lateral view. Aortic arch atherosclerosis. There is pulmonary vascular congestion compared to prior exam and the findings are compatible with chronic changes with superimposed mild CHF.  IMPRESSION: Chronic changes of the chest with superimposed pulmonary vascular congestion. Superimposed interstitial pulmonary edema may be present but is indistinguishable from the background of increased interstitial markings from interstitial lung disease.   Electronically Signed   By: Andreas Newport M.D.   On: 05/01/2013 20:47   Ct Head Wo Contrast  05/01/2013   CLINICAL DATA:  Dizziness. Syncopal episode.  EXAM: CT HEAD WITHOUT CONTRAST  TECHNIQUE: Contiguous axial images were obtained from the base of the skull through the vertex without intravenous contrast.  COMPARISON:  Head CT 08/22/2010.  FINDINGS: There is chronic atrophy with minimally progressive periventricular and subcortical white matter disease bilaterally. No cortical based infarct is  identified. There is no evidence of acute intracranial hemorrhage, mass lesion, brain edema or extra-axial fluid collection.  Left maxillary sinus mucous retention cyst is noted. The additional visualized paranasal sinuses, mastoid air cells and middle ears are clear. The calvarium is intact.  IMPRESSION: Mildly progressive atrophy and chronic small vessel ischemic changes. No acute intracranial findings identified.   Electronically Signed   By: Roxy Horseman   On: 05/01/2013 20:43   EKG independently reviewed by myself: Normal sinus rhythm with a rate of 72, wandering baseline, no evidence of ST elevation or acute ischemia, normal intervals no signs of arrhythmia MDM   1. Dizziness   2. Syncope    This a 79 year old male who presents with dizziness and syncope. He is nontoxic-appearing on exam. Orthostatics were obtained and the patient was symptomatic; however, he was not orthostatic by vital sign criteria. He was given a 500 cc bolus. Chest x-ray shows evidence of pulmonary congestion and further fluids were held. CT scan is negative. Patient has chronic kidney disease and is not a candidate for a contrasted MRI/MRA. Patient denies any vertiginous symptoms but continues to endorse generalized dizziness and feeling off balance. EKG was obtained and shows no evidence of arrhythmia or interval prolongation.  Given patient's age and continued symptoms, he will be admitted to the hospitalist. Hospitalist request noncontrasted MRI to evaluate for posterior infarct.   Shon Baton, MD 05/01/13 [phone]

## 2013-05-01 NOTE — ED Notes (Signed)
Pt reports feeling dizzy today and having a syncopal episode, denies pain, skin tear to R upper arm.

## 2013-05-01 NOTE — Progress Notes (Signed)
CSW met with pt at bedside.  Pt reports that he is currently a resident at [facility] and that he plans to return once he is medically stable.  Pts son, [name] was at bedside and is pts HCPOA.  Pts son confirmed that his contact number is [phone].  Family thanked CSW for concern.  Marva Panda, Theresia Majors  [phone] .05/01/2013 9:10 pm

## 2013-05-02 ENCOUNTER — Observation Stay (HOSPITAL_COMMUNITY): Payer: Medicare Other

## 2013-05-02 DIAGNOSIS — R42 Dizziness and giddiness: Secondary | ICD-10-CM

## 2013-05-02 DIAGNOSIS — I10 Essential (primary) hypertension: Secondary | ICD-10-CM

## 2013-05-02 DIAGNOSIS — J9610 Chronic respiratory failure, unspecified whether with hypoxia or hypercapnia: Secondary | ICD-10-CM

## 2013-05-02 LAB — TROPONIN I
Troponin I: <0.3 ng/mL (ref <0.30)
Troponin I: <0.3 ng/mL (ref <0.30)

## 2013-05-02 LAB — VITAMIN B12: Vitamin B-12: 312 pg/mL (ref 211–911)

## 2013-05-02 LAB — MRSA PCR SCREENING: MRSA by PCR: NEGATIVE

## 2013-05-02 MED ORDER — ACETAMINOPHEN 650 MG RE SUPP: 650.0000 mg | Freq: Four times a day (QID) | RECTAL | Status: DC | PRN

## 2013-05-02 MED ORDER — ALBUTEROL SULFATE (5 MG/ML) 0.5% IN NEBU
2.5000 mg | INHALATION_SOLUTION | Freq: Four times a day (QID) | RESPIRATORY_TRACT | Status: DC
  Filled 2013-05-02: qty 0.5

## 2013-05-02 MED ORDER — PANTOPRAZOLE SODIUM 40 MG PO TBEC
40.0000 mg | DELAYED_RELEASE_TABLET | Freq: Every day | ORAL | Status: DC
  Administered 2013-05-02 – 2013-05-03 (×2): 40 mg via ORAL
  Filled 2013-05-02 (×2): qty 1

## 2013-05-02 MED ORDER — METOPROLOL SUCCINATE 12.5 MG HALF TABLET
12.5000 mg | ORAL_TABLET | Freq: Every day | ORAL | Status: DC
  Administered 2013-05-02 – 2013-05-03 (×2): 12.5 mg via ORAL
  Filled 2013-05-02 (×2): qty 1

## 2013-05-02 MED ORDER — ZOLPIDEM TARTRATE 5 MG PO TABS
5.0000 mg | ORAL_TABLET | Freq: Every evening | ORAL | Status: DC | PRN
  Administered 2013-05-02: 23:00:00 5 mg via ORAL
  Filled 2013-05-02: qty 1

## 2013-05-02 MED ORDER — HEPARIN SODIUM (PORCINE) 5000 UNIT/ML IJ SOLN
5000.0000 [IU] | Freq: Three times a day (TID) | INTRAMUSCULAR | Status: DC
  Administered 2013-05-02 – 2013-05-03 (×4): 5000 [IU] via SUBCUTANEOUS
  Filled 2013-05-02 (×8): qty 1

## 2013-05-02 MED ORDER — ALBUTEROL SULFATE (5 MG/ML) 0.5% IN NEBU
2.5000 mg | INHALATION_SOLUTION | Freq: Two times a day (BID) | RESPIRATORY_TRACT | Status: DC
  Administered 2013-05-02 – 2013-05-03 (×2): 2.5 mg via RESPIRATORY_TRACT
  Filled 2013-05-02 (×4): qty 0.5

## 2013-05-02 MED ORDER — LEVALBUTEROL TARTRATE 45 MCG/ACT IN AERO
2.0000 | INHALATION_SPRAY | RESPIRATORY_TRACT | Status: DC | PRN
  Filled 2013-05-02: qty 15

## 2013-05-02 MED ORDER — VITAMIN D3 25 MCG (1000 UNIT) PO TABS
1000.0000 [IU] | ORAL_TABLET | Freq: Every morning | ORAL | Status: DC
  Administered 2013-05-02 – 2013-05-03 (×2): 1000 [IU] via ORAL
  Filled 2013-05-02 (×2): qty 1

## 2013-05-02 MED ORDER — ALBUTEROL SULFATE (5 MG/ML) 0.5% IN NEBU
2.5000 mg | INHALATION_SOLUTION | Freq: Four times a day (QID) | RESPIRATORY_TRACT | Status: DC | PRN
Start: 2013-05-02 — End: 2013-05-03

## 2013-05-02 MED ORDER — ACETAMINOPHEN 325 MG PO TABS: 650.0000 mg | ORAL_TABLET | Freq: Four times a day (QID) | ORAL | Status: DC | PRN

## 2013-05-02 MED ORDER — SODIUM CHLORIDE 0.9 % IJ SOLN
3.0000 mL | Freq: Two times a day (BID) | INTRAMUSCULAR | Status: DC
  Administered 2013-05-02 – 2013-05-03 (×3): 3 mL via INTRAVENOUS

## 2013-05-02 MED ORDER — IPRATROPIUM BROMIDE 0.02 % IN SOLN
0.5000 mg | Freq: Two times a day (BID) | RESPIRATORY_TRACT | Status: DC
  Administered 2013-05-02 – 2013-05-03 (×2): 0.5 mg via RESPIRATORY_TRACT
  Filled 2013-05-02 (×4): qty 2.5

## 2013-05-02 MED ORDER — BUDESONIDE-FORMOTEROL FUMARATE 160-4.5 MCG/ACT IN AERO
2.0000 | INHALATION_SPRAY | Freq: Two times a day (BID) | RESPIRATORY_TRACT | Status: DC
  Administered 2013-05-02 (×2): 2 via RESPIRATORY_TRACT
  Filled 2013-05-02 (×2): qty 6

## 2013-05-02 MED ORDER — MECLIZINE HCL 12.5 MG PO TABS
12.5000 mg | ORAL_TABLET | Freq: Three times a day (TID) | ORAL | Status: DC
  Administered 2013-05-02 – 2013-05-03 (×4): 12.5 mg via ORAL
  Filled 2013-05-02 (×7): qty 1

## 2013-05-02 MED ORDER — FLUTICASONE PROPIONATE 50 MCG/ACT NA SUSP
2.0000 | Freq: Every day | NASAL | Status: DC
  Administered 2013-05-03: 10:00:00 2 via NASAL
  Filled 2013-05-02: qty 16

## 2013-05-02 MED ORDER — IPRATROPIUM BROMIDE 0.02 % IN SOLN: 0.5000 mg | Freq: Four times a day (QID) | RESPIRATORY_TRACT | Status: DC | PRN

## 2013-05-02 MED ORDER — FUROSEMIDE 40 MG PO TABS
60.0000 mg | ORAL_TABLET | Freq: Every day | ORAL | Status: DC
  Administered 2013-05-02 – 2013-05-03 (×2): 60 mg via ORAL
  Filled 2013-05-02 (×2): qty 1

## 2013-05-02 MED ORDER — NITROGLYCERIN 0.4 MG SL SUBL: 0.4000 mg | SUBLINGUAL_TABLET | SUBLINGUAL | Status: DC | PRN

## 2013-05-02 MED ORDER — TERAZOSIN HCL 5 MG PO CAPS
5.0000 mg | ORAL_CAPSULE | Freq: Every day | ORAL | Status: DC
  Administered 2013-05-02: 01:00:00 5 mg via ORAL
  Filled 2013-05-02 (×2): qty 1

## 2013-05-02 MED ORDER — SODIUM CHLORIDE 0.9 % IV SOLN
INTRAVENOUS | Status: DC
  Administered 2013-05-02: 02:00:00 via INTRAVENOUS

## 2013-05-02 MED ORDER — ALLOPURINOL 300 MG PO TABS
300.0000 mg | ORAL_TABLET | Freq: Every morning | ORAL | Status: DC
  Administered 2013-05-02 – 2013-05-03 (×2): 300 mg via ORAL
  Filled 2013-05-02 (×2): qty 1

## 2013-05-02 MED ORDER — HYDROCODONE-ACETAMINOPHEN 5-325 MG PO TABS: 1.0000 | ORAL_TABLET | Freq: Four times a day (QID) | ORAL | Status: DC | PRN

## 2013-05-02 NOTE — Progress Notes (Signed)
[facility] PROGRESS NOTE  [name] ZOX:[id number] DOB: September 09, 1918 DOA: 05/01/2013 PCP: Benita Stabile, MD  Brief history 79 year old pleasant male with history of CAD, hypertension, chronic kidney disease stage III, history of vertigo, history of renal artery stenosis who lives in an assisted living with his wife was brought to the ED after he sustained a mechanical fall at home on 2 occasions. There was no syncope. On each occasion, the patient's fall was precipitated by intense vertigo and dizziness after a positional change. Patient states that he has had intermittent vertigo and dizziness with positional changes for many years, but his recent episode prior to admission was worse than usual. He was admitted for observation and workup for his vertigo.  Assessment/Plan: Vertigo -Orthostatics negative; UA neg for pyuria -Dix-Hallpike maneuver was negative although the patient does have lateral nystagmus on physical exam -MRI brain negative for acute infarct -May be related to medications, particularly his terazosin as well as furosemide and metoprolol -After discussing the risks, benefits, and alternatives the patient agreed to discontinue terazosin for the next 7 days and watch for improvement -Cycle troponins; EKG--sinus without ST-T changes -Carotid ultrasound to assess possible vertebro-basilar insuff -Check TSH, serum B12, RBC folate -PT evaluation -Continue meclizine -Echocardiogram 02/19/2013--EF 55-60% without significant valvular abnormalities nor wall motion abnormalities Hypertension -Continue metoprolol succinate -Amlodipine was held at the time of admission -Blood pressure remains well-controlled CKD stage III -Renal function remained stable -Baseline creatinine 1.6-1.9 COPD -Stable Family Communication:   Pt at beside Disposition Plan:   Home 05/03/13 if w/u is neg       Procedures/Studies: Dg Chest 2 View  05/01/2013   CLINICAL DATA:  Loss of  consciousness. Weakness. Short of breath.  EXAM: CHEST  2 VIEW  COMPARISON:  02/28/2013.  FINDINGS: Cardiomegaly and chronic interstitial lung disease appears similar to the prior examination. Monitoring leads project over the chest. Electronic device projects over the right upper quadrant of the abdomen which is new compared to the prior exam. This appears to be external to the patient on the lateral view. Aortic arch atherosclerosis. There is pulmonary vascular congestion compared to prior exam and the findings are compatible with chronic changes with superimposed mild CHF.  IMPRESSION: Chronic changes of the chest with superimposed pulmonary vascular congestion. Superimposed interstitial pulmonary edema may be present but is indistinguishable from the background of increased interstitial markings from interstitial lung disease.   Electronically Signed   By: Andreas Newport M.D.   On: 05/01/2013 20:47   Ct Head Wo Contrast  05/01/2013   CLINICAL DATA:  Dizziness. Syncopal episode.  EXAM: CT HEAD WITHOUT CONTRAST  TECHNIQUE: Contiguous axial images were obtained from the base of the skull through the vertex without intravenous contrast.  COMPARISON:  Head CT 08/22/2010.  FINDINGS: There is chronic atrophy with minimally progressive periventricular and subcortical white matter disease bilaterally. No cortical based infarct is identified. There is no evidence of acute intracranial hemorrhage, mass lesion, brain edema or extra-axial fluid collection.  Left maxillary sinus mucous retention cyst is noted. The additional visualized paranasal sinuses, mastoid air cells and middle ears are clear. The calvarium is intact.  IMPRESSION: Mildly progressive atrophy and chronic small vessel ischemic changes. No acute intracranial findings identified.   Electronically Signed   By: Roxy Horseman   On: 05/01/2013 20:43   Mr Brain Wo Contrast  05/02/2013   CLINICAL DATA:  79 year old male with fall. Possible posterior  circulation ischemia.  EXAM: MRI HEAD WITHOUT CONTRAST  TECHNIQUE:  Multiplanar, multisequence MR imaging was performed. No intravenous contrast was administered.  COMPARISON:  Head CTs 05/01/2013 and earlier.  FINDINGS: No restricted diffusion to suggest acute infarction. No midline shift, mass effect, evidence of mass lesion, ventriculomegaly, extra-axial collection or acute intracranial hemorrhage. Cervicomedullary junction and pituitary are within normal limits. Negative visualized cervical spine. Major intracranial vascular flow voids are preserved. There is generalized intracranial artery dolichoectasia.  Patchy in confluent cerebral white matter T2 and FLAIR hyperintensity. Widespread T2 heterogeneity in the deep gray matter nuclei mostly your reflecting dilated perivascular spaces. Mild to moderate patchy T2 and FLAIR hyperintensity in the pons. Occasional small chronic lacunar infarcts in the cerebellum, with dilated perivascular spaces incidentally noted in the deep cerebellar nuclei. Occasional chronic micro hemorrhages also noted in the brain.  Postoperative changes to the right globe. Left maxillary sinus mucous retention cyst. Fluid signal in the right mastoid air cells. Negative visualized nasopharynx. Other visualized internal auditory structures are grossly normal. Normal bone marrow signal. Negative scalp soft tissues.  IMPRESSION: 1. No acute intracranial abnormality.  2. Constellation of findings in the brain most compatible with chronic small vessel disease/hypertensive encephalopathy.   Electronically Signed   By: Augusto Gamble M.D.   On: 05/02/2013 08:04         Subjective: Patient states her dizziness is a little better. Denies any fevers, chills, chest pain, worsening shortness of breath, nausea, vomiting, diarrhea, dysuria, hematuria.  Objective: Filed Vitals:   05/02/13 0049 05/02/13 0424 05/02/13 1049 05/02/13 1300  BP: 135/69 119/62 128/62 119/65  Pulse: 67 69 75 62  Temp:  97.6 F (36.4 C) 98 F (36.7 C)  98.2 F (36.8 C)  TempSrc: Oral Oral  Oral  Resp: 18 16  15   Height: 5\' 7"  (1.702 m)     Weight: 86 kg (189 lb 9.5 oz)     SpO2: 100% 98%  99%    Intake/Output Summary (Last 24 hours) at 05/02/13 1919 Last data filed at 05/02/13 1633  Gross per 24 hour  Intake   1195 ml  Output   1200 ml  Net     -5 ml   Weight change:  Exam:   General:  Pt is alert, follows commands appropriately, not in acute distress  HEENT: No icterus, No thrush,  [place]/AT  Cardiovascular: RRR, S1/S2, no rubs, no gallops  Respiratory: Diminished breath sounds bilaterally without any wheezing or crackles or rhonchi.  Abdomen: Soft/+BS, non tender, non distended, no guarding  Extremities: No edema, No lymphangitis, No petechiae, No rashes, no synovitis  Data Reviewed: Basic Metabolic Panel:  Recent Labs Lab 05/01/13 2050  NA 137  K 4.6  CL 100  CO2 25  GLUCOSE 95  BUN 25*  CREATININE 1.70*  CALCIUM 8.6   Liver Function Tests: No results found for this basename: AST, ALT, ALKPHOS, BILITOT, PROT, ALBUMIN,  in the last 168 hours No results found for this basename: LIPASE, AMYLASE,  in the last 168 hours No results found for this basename: AMMONIA,  in the last 168 hours CBC:  Recent Labs Lab 05/01/13 2050  WBC 5.7  NEUTROABS 3.5  HGB 12.1*  HCT 37.4*  MCV 98.4  PLT 175   Cardiac Enzymes:  Recent Labs Lab 05/02/13 1525  TROPONINI <0.30   BNP: No components found with this basename: POCBNP,  CBG: No results found for this basename: GLUCAP,  in the last 168 hours  Recent Results (from the past 240 hour(s))  MRSA PCR SCREENING  Status: None   Collection Time    05/02/13  7:19 AM      Result Value Range Status   MRSA by PCR NEGATIVE  NEGATIVE Final   Comment:            The GeneXpert MRSA Assay (FDA     approved for NASAL specimens     only), is one component of a     comprehensive MRSA colonization     surveillance program. It is not      intended to diagnose MRSA     infection nor to guide or     monitor treatment for     MRSA infections.     Scheduled Meds: . albuterol  2.5 mg Nebulization BID  . allopurinol  300 mg Oral q morning - 10a  . budesonide-formoterol  2 puff Inhalation BID  . cholecalciferol  1,000 Units Oral q morning - 10a  . fluticasone  2 spray Each Nare Daily  . furosemide  60 mg Oral Daily  . heparin  5,000 Units Subcutaneous Q8H  . ipratropium  0.5 mg Nebulization BID  . meclizine  12.5 mg Oral TID  . metoprolol succinate  12.5 mg Oral Daily  . pantoprazole  40 mg Oral Daily  . sodium chloride  3 mL Intravenous Q12H   Continuous Infusions:    Lee Tolen, DO  [facility] Pager [phone]  If 7PM-7AM, please contact night-coverage [email or web address] Password [facility] 05/02/2013, 7:19 PM   LOS: 1 day

## 2013-05-02 NOTE — Progress Notes (Signed)
*  PRELIMINARY RESULTS* Vascular Ultrasound Carotid Duplex (Doppler) has been completed.  Preliminary findings: Bilateral:  1-39% ICA stenosis.  Due to calcific plaque with acoustic shadowing at bilateral carotid bulb, higher velocities could be obscured. Bilateral Vertebral arteries are patent with antegrade flow.      Farrel Demark, RDMS, RVT  05/02/2013, 3:44 PM

## 2013-05-02 NOTE — Progress Notes (Signed)
Physical Therapy Treatment Patient Details Name: [name] MRN: [id number] DOB: 03-28-1919 Today's Date: 05/02/2013 Time: 1424-1500 PT Time Calculation (min): 36 min  PT Assessment / Plan / Recommendation  History of Present Illness [name] with history of vertigo, hypertension, coronary artery disease, and renal artery stenosis who presents with syncope. Patient lives in an independent living facility. He was found earlier by CNA on the Foster. The patient endorsing to be and dizziness to EMS and the nurse.    PT Comments   Went back to see patient a second time today to follow up with findings from the morning session. In the morning session , pt was found to have a little nystagmus on side side when testing EOB using epley maneuver. With his history and descriptions of when he gets dizzy, we return to test horizontal canals and anterior/ posterior. We performed supine horizontal canal testing with no provoking os nystagmus or symptoms. Then we continued with the Canalith maneuver starting in long sitting, with no symptoms of dizziness during this positioning procedure. When then took patient for a walk and patient abe to walk with  the RW with less reports of "LEs feeling weak" at end of walk. Did have 2 very short  (Less than 30 second) bouts of dizziness while walking provoked by a downward gaze and then looking back straight ahead.   We then talked a lot about safety and what positions to avoid with bending to pick up items (avoid having head lower ), encouraged use of reacher to pick up items or call for someone else to help with this. Also to avoid glancing down while moving to try to keep eyes fixed straight ahead on objects.   I talked to pt about how I recommend HHPT to continue f/u especially with vestibular reassessment. Pt willing to possibly try, but did have some reservations from experiences in the past.     Follow Up Recommendations  Home health PT      Does the patient have the potential to tolerate intense rehabilitation     Barriers to Discharge        Equipment Recommendations  None recommended by PT (pt states he has a RW he can/will use)    Recommendations for Other Services OT consult  Frequency Min 3X/week   Progress towards PT Goals Progress towards PT goals: Progressing toward goals  Plan Current plan remains appropriate    Precautions / Restrictions Precautions Precautions: Fall   Pertinent Vitals/Pain No reports of pain    Mobility  Bed Mobility Bed Mobility: Supine to Sit;Sit to Supine Supine to Sit: 4: Min assist Sit to Supine: 4: Min assist Details for Bed Mobility Assistance: pt with some difficulty on hospital mattress Transfers Transfers: Sit to Stand;Stand to Sit Sit to Stand: 5: Supervision Stand to Sit: 5: Supervision Ambulation/Gait Ambulation/Gait Assistance: 5: Supervision Ambulation Distance (Feet): 75 Feet Assistive device: Rolling walker Ambulation/Gait Assistance Details: cues for rolling walker proper use and safety Gait Pattern: Within Functional Limits Gait velocity: decreased General Gait Details: pt gets dizziness with slight vertical head movment (ie. anytime he looks down and back up he gets slightly dizzy that subsides after 30 seconds or less and he did not loss his balance, just stood still at RW until subsided.  Stairs: No Wheelchair Mobility Wheelchair Mobility: No    Exercises Other Exercises Other Exercises: performed vestibular reassessment and canalith procedure for testing anteriro/posterior canals,a dn supine horizontal canal testing. See Treatment comments  for details.    PT Diagnosis: Difficulty walking  PT Problem List: Decreased activity tolerance;Other (comment) (dizziness) PT Treatment Interventions: Therapeutic activities;Therapeutic exercise;Functional mobility training;Patient/family education;Gait training   PT Goals (current goals can now be found in the care  plan section) Acute Rehab PT Goals Patient Stated Goal: I want to try to go home tomorrow.  PT Goal Formulation: With patient Time For Goal Achievement: 05/16/13 Potential to Achieve Goals: Good  Visit Information  Last PT Received On: 05/02/13 Assistance Needed: +1 History of Present Illness: [name] with history of vertigo, hypertension, coronary artery disease, and renal artery stenosis who presents with syncope. Patient lives in an independent living facility. He was found earlier by CNA on the [place]. The patient endorsing to be and dizziness to EMS and the nurse.     Subjective Data  Subjective: I feel a little better.  Patient Stated Goal: I want to try to go home tomorrow.    Cognition  Cognition Arousal/Alertness: Awake/alert Behavior During Therapy: WFL for tasks assessed/performed Overall Cognitive Status: Within Functional Limits for tasks assessed       End of Session PT - End of Session Equipment Utilized During Treatment: Gait belt Activity Tolerance: Patient tolerated treatment well Patient left: in bed;with call bell/phone within reach;with nursing/sitter in room Nurse Communication: Mobility status    Marella Bile 05/02/2013, 3:56 PM Marella Bile, PT Pager: [phone] 05/02/2013

## 2013-05-02 NOTE — Evaluation (Signed)
Physical Therapy Evaluation Patient Details Name: [name] MRN: [id number] DOB: 1919/06/25 Today's Date: 05/02/2013 Time: 1100-1135 PT Time Calculation (min): 35 min  PT Assessment / Plan / Recommendation History of Present Illness  [name] with history of vertigo, hypertension, coronary artery disease, and renal artery stenosis who presents with syncope. Patient lives in an independent living facility. He had 2 falls just prior to admission.. The patient endorses  dizziness to EMS and the nurse.   Clinical Impression  Pt subjectively reports dizziness causing the fall happening as a result of bending forward to pick something up from the floor and standing up suddenly.  He also states he generally has dizziness when sitting up from a lying down position. Mild nystagmus was identified with vestibular testing.  Will return later today to perform other tests and techniques to improve the dizziness.  Pt wants to return to his apartment although he may need some assist to care for his wife.    PT Assessment  Patient needs continued PT services    Follow Up Recommendations  Home health PT    Does the patient have the potential to tolerate intense rehabilitation      Barriers to Discharge        Equipment Recommendations  None recommended by PT    Recommendations for Other Services OT consult   Frequency Min 3X/week    Precautions / Restrictions Precautions Precautions: Fall   Pertinent Vitals/Pain After walking BP 134/61  O2 sats 97%, heart rate 79      Mobility  Bed Mobility Bed Mobility: Supine to Sit;Sit to Supine Supine to Sit: 4: Min assist Sit to Supine: 4: Min assist Details for Bed Mobility Assistance: pt with some difficulty on hospital mattress Transfers Transfers: Sit to Stand;Stand to Sit Sit to Stand: 5: Supervision Stand to Sit: 5: Supervision Ambulation/Gait Ambulation/Gait Assistance: 5: Supervision Ambulation Distance (Feet): 75  Feet Assistive device: Rolling walker Ambulation/Gait Assistance Details: pt using RW for stabilty  Gait Pattern: Within Functional Limits Gait velocity: decreased General Gait Details: pt states his leg feel wobbly.  He had one episode of dizziness while walking.  He stopped, looked straight ahead and dizziness decreased. Stairs: No Wheelchair Mobility Wheelchair Mobility: No    Exercises     PT Diagnosis: Difficulty walking  PT Problem List: Decreased activity tolerance;Other (comment) (dizziness) PT Treatment Interventions: Therapeutic activities;Therapeutic exercise;Functional mobility training;Patient/family education;Gait training     PT Goals(Current goals can be found in the care plan section) Acute Rehab PT Goals Patient Stated Goal: to go back to his apartment PT Goal Formulation: With patient Time For Goal Achievement: 05/16/13 Potential to Achieve Goals: Good  Visit Information  Last PT Received On: 05/02/13 Assistance Needed: +1 History of Present Illness: [name] with history of vertigo, hypertension, coronary artery disease, and renal artery stenosis who presents with syncope. Patient lives in an independent living facility. He was found earlier by CNA on the floor. The patient endorsing to be and dizziness to EMS and the nurse.        Prior Functioning       Cognition  Cognition Arousal/Alertness: Awake/alert Behavior During Therapy: WFL for tasks assessed/performed Overall Cognitive Status: Within Functional Limits for tasks assessed    Extremity/Trunk Assessment Upper Extremity Assessment Upper Extremity Assessment: RUE deficits/detail RUE Deficits / Details: pt with laceration on R elbow from his fall, currently dressed with kerlix bandage Lower Extremity Assessment Lower Extremity Assessment: Overall [facility]  for tasks assessed (some edema in LLE) Cervical / Trunk Assessment Cervical / Trunk Assessment: Normal   Balance Balance Balance  Assessed: Yes Static Sitting Balance Static Sitting - Balance Support: No upper extremity supported;Feet supported Static Sitting - Level of Assistance: 7: Independent Static Standing Balance Static Standing - Balance Support: No upper extremity supported;During functional activity Static Standing - Level of Assistance: 5: Stand by assistance  End of Session PT - End of Session Equipment Utilized During Treatment: Gait belt Activity Tolerance: Patient tolerated treatment well Patient left: in chair;with call bell/phone within reach Nurse Communication: Mobility status  GP Functional Assessment Tool Used: clinical judgement Functional Limitation: Mobility: Walking and moving around Mobility: Walking and Moving Around Current Status [phone]): At least 20 percent but less than 40 percent impaired, limited or restricted Mobility: Walking and Moving Around Goal Status [phone]): At least 1 percent but less than 20 percent impaired, limited or restricted    Rosey Bath K. Manson Passey, [place] [phone] 05/02/2013, 12:55 PM

## 2013-05-02 NOTE — Care Management Note (Addendum)
    Page 1 of 2   05/03/2013     11:41:31 AM   CARE MANAGEMENT NOTE 05/03/2013  Patient:  [name], [name]   Account Number:  [id number]  Date Initiated:  05/02/2013  Documentation initiated by:  Lanier Clam  Subjective/Objective Assessment:   79Y/O M ADMITTED W/VERTIGO.HX:CAD,HTN.     Action/Plan:   FROM FACILITY.   Anticipated DC Date:  05/03/2013   Anticipated DC Plan:  HOME W HOME HEALTH SERVICES      DC Planning Services  CM consult      Choice offered to / List presented to:  C-1 Patient        HH arranged  HH-2 PT      HH agency  [facility]   Status of service:  Completed, signed off Medicare Important Message given?   (If response is "NO", the following Medicare IM given date fields will be blank) Date Medicare IM given:   Date Additional Medicare IM given:    Discharge Disposition:  HOME W HOME HEALTH SERVICES  Per UR Regulation:  Reviewed for med. necessity/level of care/duration of stay  If discussed at Long Length of Stay Meetings, dates discussed:    Comments:  05/03/13 Leah Skora RN,BSN NCM [phone] FAXED D/C ORDER W/CONFIRMATION TO [facility].  05/02/13 Fernand Sorbello RN,BSN NCM [phone] FAXED W/CONFIRMATION HHPT ORDER. PT-HH[facility] TEL#[phone],FAX#[phone] SPOKE TO [facility] DIR OF HH,INFORMED OF HH NEEDED,THEY HAVE CONTRACT W[facility].WILL FAX HH ORDER ONCE RECIEVED.MD UPDATED. AWAIT  PT RECOMMENDATIONS.

## 2013-05-03 LAB — BASIC METABOLIC PANEL
BUN: 22 mg/dL (ref 6–23)
CO2: 34 mEq/L — ABNORMAL HIGH (ref 19–32)
Chloride: 100 mEq/L (ref 96–112)
Creatinine, Ser: 1.45 mg/dL — ABNORMAL HIGH (ref 0.50–1.35)
Glucose, Bld: 99 mg/dL (ref 70–99)
Potassium: 3.5 mEq/L (ref 3.5–5.1)

## 2013-05-03 MED ORDER — MECLIZINE HCL 12.5 MG PO TABS: 12.5000 mg | ORAL_TABLET | Freq: Three times a day (TID) | ORAL | Status: AC

## 2013-05-03 NOTE — Discharge Summary (Signed)
Physician Discharge Summary  Ashanti Littles Luoma YQI:[id number] DOB: 14-Aug-1918 DOA: 05/01/2013  PCP: Benita Stabile, MD  Admit date: 05/01/2013 Discharge date: 05/03/2013  Time spent: 35 minutes  Recommendations for Outpatient Follow-up:  1. Follow up with PCP early next week  Recommendations for primary care physician for things to follow:  BMP recheck Vertigo symptoms re-evaluation  Discharge Diagnoses:  Principal Problem:   BPPV (benign paroxysmal positional vertigo) Active Problems:   Heart disease   Hypertension   COPD GOLD I   CKD (chronic kidney disease) stage 3, GFR 30-59 ml/min   Fall at home  Discharge Condition: stable  Diet recommendation: heart healthy  Filed Weights   05/02/13 0049  Weight: 86 kg (189 lb 9.5 oz)   History of present illness:  79 year old pleasant male with history of CAD, hypertension, chronic kidney disease stage III, history of vertigo, history of renal artery stenosis who lives in an assisted living with his wife was brought to the ED after he sustained a mechanical fall at home on 2 occasions. There was no syncope. On each occasion, the patient's fall was precipitated by intense vertigo and dizziness after a positional change. Patient states that he has had intermittent vertigo and dizziness with positional changes for many years, but his recent episode prior to admission was worse than usual. He was admitted for observation and workup for his vertigo.  Hospital Course:  Vertigo  - Orthostatics negative; UA neg for pyuria  - Dix-Hallpike maneuver was negative although the patient does have lateral nystagmus on physical exam  - MRI brain negative for acute infarct  - May be related to medications, particularly his terazosin as well as furosemide and metoprolol  - After discussing the risks, benefits, and alternatives the patient agreed to discontinue terazosin for the next 7 days and watch for improvement. Advised to follow up with PCP early  next week.  - Cycle troponins, negative; EKG--sinus without ST-T changes  - Carotid ultrasound to assess possible vertebro-basilar insufficiency without acute findings - Check TSH, normal, serum B12, normal, RBC folate, elevated - PT evaluation, to have HHPT - Continue meclizine, seems to be helping, will prescribe on discharge.  - Echocardiogram 02/19/2013--EF 55-60% without significant valvular abnormalities nor wall motion abnormalities  Hypertension  - Continue metoprolol succinate  - Amlodipine was held at the time of admission, but needs to be restarted at home.  CKD stage III  - Renal function remained stable  - Baseline creatinine 1.6-1.9. Cr stable on discharge.  COPD  - Stable  Procedures:  Carotid doppler - The vertebral arteries appear patent with antegrade flow. - Findings consistent with 1-39 percent stenosis involving the right internal carotid artery and the left internal carotid artery.  Consultations:  none  Discharge Exam: Filed Vitals:   05/02/13 1300 05/02/13 2124 05/03/13 0510 05/03/13 0948  BP: 119/65 128/65 127/63 125/60  Pulse: 62 56 69 66  Temp: 98.2 F (36.8 C) 98.8 F (37.1 C) 98.3 F (36.8 C)   TempSrc: Oral Oral Oral   Resp: 15 16 16    Height:      Weight:      SpO2: 99% 94% 92%    General: NAD Cardiovascular: RRR Respiratory: CTA biL  Discharge Instructions    Medication List    STOP taking these medications       terazosin 5 MG capsule  Commonly known as:  HYTRIN      TAKE these medications       acetaminophen 500 MG tablet  Commonly known as:  TYLENOL  Take 500 mg by mouth every 6 (six) hours as needed for pain.     allopurinol 300 MG tablet  Commonly known as:  ZYLOPRIM  Take 300 mg by mouth every morning.     amLODipine 10 MG tablet  Commonly known as:  NORVASC  Take 5 mg by mouth daily.     budesonide-formoterol 160-4.5 MCG/ACT inhaler  Commonly known as:  SYMBICORT  Inhale 2 puffs into the lungs 2 (two) times  daily.     cholecalciferol 1000 UNITS tablet  Commonly known as:  VITAMIN D  Take 1,000 Units by mouth every morning.     fluticasone 50 MCG/ACT nasal spray  Commonly known as:  FLONASE  Place 2 sprays into the nose daily.     furosemide 40 MG tablet  Commonly known as:  LASIX  Take 1.5 tablets (60 mg total) by mouth See admin instructions.     HYDROcodone-acetaminophen 5-325 MG per tablet  Commonly known as:  NORCO/VICODIN  Take 1 tablet by mouth every 6 (six) hours as needed for pain.     ICAPS AREDS FORMULA PO  Take 1 capsule by mouth 2 (two) times daily.     levalbuterol 45 MCG/ACT inhaler  Commonly known as:  XOPENEX HFA  Inhale 2 puffs into the lungs every 4 (four) hours as needed for wheezing.     meclizine 12.5 MG tablet  Commonly known as:  ANTIVERT  Take 1 tablet (12.5 mg total) by mouth 3 (three) times daily.     metoprolol succinate 25 MG 24 hr tablet  Commonly known as:  TOPROL-XL  Take 12.5 mg by mouth daily.     nitroGLYCERIN 0.4 MG SL tablet  Commonly known as:  NITROSTAT  Place 0.4 mg under the tongue every 5 (five) minutes as needed for chest pain.     omeprazole 20 MG capsule  Commonly known as:  PRILOSEC  Take 20 mg by mouth 2 (two) times daily.         The results of significant diagnostics from this hospitalization (including imaging, microbiology, ancillary and laboratory) are listed below for reference.    Significant Diagnostic Studies: Dg Chest 2 View  05/01/2013   CLINICAL DATA:  Loss of consciousness. Weakness. Short of breath.  EXAM: CHEST  2 VIEW  COMPARISON:  02/28/2013.  FINDINGS: Cardiomegaly and chronic interstitial lung disease appears similar to the prior examination. Monitoring leads project over the chest. Electronic device projects over the right upper quadrant of the abdomen which is new compared to the prior exam. This appears to be external to the patient on the lateral view. Aortic arch atherosclerosis. There is pulmonary  vascular congestion compared to prior exam and the findings are compatible with chronic changes with superimposed mild CHF.  IMPRESSION: Chronic changes of the chest with superimposed pulmonary vascular congestion. Superimposed interstitial pulmonary edema may be present but is indistinguishable from the background of increased interstitial markings from interstitial lung disease.   Electronically Signed   By: Andreas Newport M.D.   On: 05/01/2013 20:47   Ct Head Wo Contrast  05/01/2013   CLINICAL DATA:  Dizziness. Syncopal episode.  EXAM: CT HEAD WITHOUT CONTRAST  TECHNIQUE: Contiguous axial images were obtained from the base of the skull through the vertex without intravenous contrast.  COMPARISON:  Head CT 08/22/2010.  FINDINGS: There is chronic atrophy with minimally progressive periventricular and subcortical white matter disease bilaterally. No cortical based infarct is identified. There is  no evidence of acute intracranial hemorrhage, mass lesion, brain edema or extra-axial fluid collection.  Left maxillary sinus mucous retention cyst is noted. The additional visualized paranasal sinuses, mastoid air cells and middle ears are clear. The calvarium is intact.  IMPRESSION: Mildly progressive atrophy and chronic small vessel ischemic changes. No acute intracranial findings identified.   Electronically Signed   By: Roxy Horseman   On: 05/01/2013 20:43   Mr Brain Wo Contrast  05/02/2013   CLINICAL DATA:  79 year old male with fall. Possible posterior circulation ischemia.  EXAM: MRI HEAD WITHOUT CONTRAST  TECHNIQUE: Multiplanar, multisequence MR imaging was performed. No intravenous contrast was administered.  COMPARISON:  Head CTs 05/01/2013 and earlier.  FINDINGS: No restricted diffusion to suggest acute infarction. No midline shift, mass effect, evidence of mass lesion, ventriculomegaly, extra-axial collection or acute intracranial hemorrhage. Cervicomedullary junction and pituitary are within normal  limits. Negative visualized cervical spine. Major intracranial vascular flow voids are preserved. There is generalized intracranial artery dolichoectasia.  Patchy in confluent cerebral white matter T2 and FLAIR hyperintensity. Widespread T2 heterogeneity in the deep gray matter nuclei mostly your reflecting dilated perivascular spaces. Mild to moderate patchy T2 and FLAIR hyperintensity in the pons. Occasional small chronic lacunar infarcts in the cerebellum, with dilated perivascular spaces incidentally noted in the deep cerebellar nuclei. Occasional chronic micro hemorrhages also noted in the brain.  Postoperative changes to the right globe. Left maxillary sinus mucous retention cyst. Fluid signal in the right mastoid air cells. Negative visualized nasopharynx. Other visualized internal auditory structures are grossly normal. Normal bone marrow signal. Negative scalp soft tissues.  IMPRESSION: 1. No acute intracranial abnormality.  2. Constellation of findings in the brain most compatible with chronic small vessel disease/hypertensive encephalopathy.   Electronically Signed   By: Augusto Gamble M.D.   On: 05/02/2013 08:04    Microbiology: Recent Results (from the past 240 hour(s))  MRSA PCR SCREENING     Status: None   Collection Time    05/02/13  7:19 AM      Result Value Range Status   MRSA by PCR NEGATIVE  NEGATIVE Final   Comment:            The GeneXpert MRSA Assay (FDA     approved for NASAL specimens     only), is one component of a     comprehensive MRSA colonization     surveillance program. It is not     intended to diagnose MRSA     infection nor to guide or     monitor treatment for     MRSA infections.    Labs: Basic Metabolic Panel:  Recent Labs Lab 05/01/13 2050 05/03/13 0332  NA 137 140  K 4.6 3.5  CL 100 100  CO2 25 34*  GLUCOSE 95 99  BUN 25* 22  CREATININE 1.70* 1.45*  CALCIUM 8.6 9.3   CBC:  Recent Labs Lab 05/01/13 2050  WBC 5.7  NEUTROABS 3.5  HGB 12.1*   HCT 37.4*  MCV 98.4  PLT 175   Cardiac Enzymes:  Recent Labs Lab 05/02/13 1525 05/02/13 2047 05/03/13 0332  TROPONINI <0.30 <0.30 <0.30   BNP: BNP (last 3 results)  Recent Labs  02/17/13 1251 02/18/13 1852  PROBNP 296.0* 1508.0*    Signed: Pamella Pert  [facility] 05/03/2013, 6:04 PM

## 2013-06-15 ENCOUNTER — Other Ambulatory Visit: Payer: Self-pay

## 2013-06-15 MED ORDER — NITROGLYCERIN 0.4 MG SL SUBL: 0.4000 mg | SUBLINGUAL_TABLET | SUBLINGUAL | Status: AC | PRN

## 2013-06-24 ENCOUNTER — Emergency Department (HOSPITAL_COMMUNITY)
Admission: EM | Admit: 2013-06-24 | Discharge: 2013-06-24 | Disposition: A | Discharge status: Home / Self Care | Payer: Medicare Other | Attending: Emergency Medicine | Admitting: Emergency Medicine

## 2013-06-24 ENCOUNTER — Encounter (HOSPITAL_COMMUNITY): Payer: Self-pay | Admitting: Emergency Medicine

## 2013-06-24 ENCOUNTER — Emergency Department (HOSPITAL_COMMUNITY): Payer: Medicare Other

## 2013-06-24 DIAGNOSIS — I2510 Atherosclerotic heart disease of native coronary artery without angina pectoris: Secondary | ICD-10-CM | POA: Insufficient documentation

## 2013-06-24 DIAGNOSIS — R42 Dizziness and giddiness: Secondary | ICD-10-CM | POA: Insufficient documentation

## 2013-06-24 DIAGNOSIS — Z87891 Personal history of nicotine dependence: Secondary | ICD-10-CM | POA: Insufficient documentation

## 2013-06-24 DIAGNOSIS — Z9861 Coronary angioplasty status: Secondary | ICD-10-CM | POA: Insufficient documentation

## 2013-06-24 DIAGNOSIS — Z880 Allergy status to penicillin: Secondary | ICD-10-CM | POA: Insufficient documentation

## 2013-06-24 DIAGNOSIS — J9610 Chronic respiratory failure, unspecified whether with hypoxia or hypercapnia: Secondary | ICD-10-CM | POA: Insufficient documentation

## 2013-06-24 DIAGNOSIS — J4489 Other specified chronic obstructive pulmonary disease: Secondary | ICD-10-CM | POA: Insufficient documentation

## 2013-06-24 DIAGNOSIS — J449 Chronic obstructive pulmonary disease, unspecified: Secondary | ICD-10-CM | POA: Insufficient documentation

## 2013-06-24 DIAGNOSIS — Z87898 Personal history of other specified conditions: Secondary | ICD-10-CM | POA: Insufficient documentation

## 2013-06-24 DIAGNOSIS — N183 Chronic kidney disease, stage 3 unspecified: Secondary | ICD-10-CM | POA: Insufficient documentation

## 2013-06-24 DIAGNOSIS — Z9981 Dependence on supplemental oxygen: Secondary | ICD-10-CM | POA: Insufficient documentation

## 2013-06-24 DIAGNOSIS — IMO0002 Reserved for concepts with insufficient information to code with codable children: Secondary | ICD-10-CM | POA: Insufficient documentation

## 2013-06-24 DIAGNOSIS — Z79899 Other long term (current) drug therapy: Secondary | ICD-10-CM | POA: Insufficient documentation

## 2013-06-24 DIAGNOSIS — M109 Gout, unspecified: Secondary | ICD-10-CM | POA: Insufficient documentation

## 2013-06-24 DIAGNOSIS — I129 Hypertensive chronic kidney disease with stage 1 through stage 4 chronic kidney disease, or unspecified chronic kidney disease: Secondary | ICD-10-CM | POA: Insufficient documentation

## 2013-06-24 LAB — URINALYSIS, ROUTINE W REFLEX MICROSCOPIC
Bilirubin Urine: NEGATIVE
Glucose, UA: NEGATIVE mg/dL
Ketones, ur: NEGATIVE mg/dL
Leukocytes, UA: NEGATIVE
Protein, ur: NEGATIVE mg/dL
Specific Gravity, Urine: 1.019 (ref 1.005–1.030)
pH: 5.5 (ref 5.0–8.0)

## 2013-06-24 LAB — CBC
HCT: 42 % (ref 39.0–52.0)
Hemoglobin: 13.8 g/dL (ref 13.0–17.0)
MCH: 32.9 pg (ref 26.0–34.0)
MCV: 100 fL (ref 78.0–100.0)
RBC: 4.2 MIL/uL — ABNORMAL LOW (ref 4.22–5.81)
RDW: 15.4 % (ref 11.5–15.5)
WBC: 7.5 10*3/uL (ref 4.0–10.5)

## 2013-06-24 LAB — BASIC METABOLIC PANEL
BUN: 37 mg/dL — ABNORMAL HIGH (ref 6–23)
CO2: 23 mEq/L (ref 19–32)
Calcium: 9.1 mg/dL (ref 8.4–10.5)
Chloride: 98 mEq/L (ref 96–112)
Creatinine, Ser: 1.83 mg/dL — ABNORMAL HIGH (ref 0.50–1.35)
GFR calc Af Amer: 35 mL/min — ABNORMAL LOW (ref >90)
Potassium: 4.7 mEq/L (ref 3.5–5.1)

## 2013-06-24 LAB — BLOOD GAS, ARTERIAL
Bicarbonate: 24.7 mEq/L — ABNORMAL HIGH (ref 20.0–24.0)
Patient temperature: 37
TCO2: 22.1 mmol/L (ref 0–100)
pCO2 arterial: 45.2 mmHg — ABNORMAL HIGH (ref 35.0–45.0)
pH, Arterial: 7.357 (ref 7.350–7.450)
pO2, Arterial: 71.5 mmHg — ABNORMAL LOW (ref 80.0–100.0)

## 2013-06-24 MED ORDER — IPRATROPIUM BROMIDE 0.02 % IN SOLN
0.5000 mg | Freq: Once | RESPIRATORY_TRACT | Status: AC
  Administered 2013-06-24: 0.5 mg via RESPIRATORY_TRACT
  Filled 2013-06-24: qty 2.5

## 2013-06-24 MED ORDER — ALBUTEROL SULFATE (5 MG/ML) 0.5% IN NEBU
5.0000 mg | INHALATION_SOLUTION | Freq: Once | RESPIRATORY_TRACT | Status: AC
  Administered 2013-06-24: 5 mg via RESPIRATORY_TRACT
  Filled 2013-06-24: qty 1

## 2013-06-24 NOTE — ED Notes (Signed)
Pt ambulated in room without assistance while on oxygen

## 2013-06-24 NOTE — Progress Notes (Signed)
Contacted [facility] DME rep for oxygen for home. Isidoro Donning RN CCM Case Mgmt phone [phone]

## 2013-06-24 NOTE — ED Notes (Signed)
Waiting for oxygen to be delivered so pt can go home

## 2013-06-24 NOTE — ED Notes (Signed)
RT called

## 2013-06-24 NOTE — Progress Notes (Signed)
Per protocol, CSW contacted facility to confirm residency and level of care.  Per Elease Hashimoto, confirmed pt is a resident at [facility] independent living.  [place] [facility] [place], [phone]     ED CSW  2:03pm

## 2013-06-24 NOTE — ED Notes (Addendum)
Per ems pt is from [facility]. Pt was in dining room, staff noticed pt was having episodes of staring off, pt was verbally responsive during these times. Upon ems arrival pt alert and oriented x4, cbg 74, pt had not eaten lunch yet. Pt reports he feels more generally weak than normal. Pt amble to ambulate but with more assistance than usual. Pt said he lost his appetite at lunchtime.  Pt is unsure why he is in the ED.

## 2013-06-24 NOTE — ED Notes (Addendum)
Pt escorted to discharge window. Pt verbalized understanding discharge instructions. In no acute distress.  Pt oxygen tank delivered.

## 2013-06-24 NOTE — ED Provider Notes (Addendum)
CSN: [id number]     Arrival date & time 06/24/13  1326 History   First MD Initiated Contact with Patient 06/24/13 1501     Chief Complaint  Patient presents with  . Weakness    HPI Per ems pt is from [facility]. Pt was in dining room, staff noticed pt was having episodes of staring off, pt was verbally responsive during these times.   Pt states he remembers sitting at the dining table but felt a little dizzy/lightheaded.    He did not think he was going to pass out and the room was not spinning but things did get a little blurry.  This was happening for a few minutes at a time.  He had not eaten anything yet.  Pt states he is feeling fine now.  No CP or SOB.  No difficulty moving one side or the other.  No fever, vomiting or diarrhea.  He has had some cough with post nasal drainage.  Past Medical History  Diagnosis Date  . Vertigo   . Lymphoma   . Hypertension   . GERD (gastroesophageal reflux disease)   . Gout   . RAS (renal artery stenosis)     PCI left renal artery 2005  . Coronary artery disease     a. s/p stent to RCA in 2000;  b. Last CLite 4/07: no ichemia, EF 73%  . CKD (chronic kidney disease) stage 3, GFR 30-59 ml/min    Past Surgical History  Procedure Laterality Date  . Stent to the righ coronary in 2000    . Lung surgery    . Coronary angioplasty with stent placement    . Appendectomy    . Tonsillectomy    . Hemorroidectomy    . Cataract extraction     Family History  Problem Relation Age of Onset  . Lung cancer Brother     heavy smoker  . Heart disease Father   . Rheum arthritis Mother    History  Substance Use Topics  . Smoking status: Former Smoker -- 0.50 packs/day for 50 years    Types: Cigarettes    Quit date: 05/03/1981  . Smokeless tobacco: Never Used     Comment: "did not inhale"  . Alcohol Use: No    Review of Systems  Constitutional: Negative for fever.  Respiratory: Positive for cough.   Genitourinary: Negative for  dysuria.  Neurological: Negative for tremors and syncope.  All other systems reviewed and are negative.    Allergies  Penicillins  Home Medications   Current Outpatient Rx  Name  Route  Sig  Dispense  Refill  . acetaminophen (TYLENOL) 500 MG tablet   Oral   Take 500 mg by mouth every 6 (six) hours as needed for pain.          Marland Kitchen allopurinol (ZYLOPRIM) 300 MG tablet   Oral   Take 300 mg by mouth every morning.          Marland Kitchen amLODipine (NORVASC) 10 MG tablet   Oral   Take 5 mg by mouth daily.         . cholecalciferol (VITAMIN D) 1000 UNITS tablet   Oral   Take 1,000 Units by mouth every morning.         Marland Kitchen dextromethorphan-guaiFENesin (MUCINEX DM) 30-600 MG per 12 hr tablet   Oral   Take 2 tablets by mouth 2 (two) times daily.         . fluticasone (FLONASE) 50  MCG/ACT nasal spray   Nasal   Place 2 sprays into the nose daily.   16 g   0   . furosemide (LASIX) 40 MG tablet   Oral   Take 1.5 tablets (60 mg total) by mouth See admin instructions.   45 tablet   0   . HYDROcodone-acetaminophen (NORCO/VICODIN) 5-325 MG per tablet   Oral   Take 1 tablet by mouth every 6 (six) hours as needed for pain.         . meclizine (ANTIVERT) 12.5 MG tablet   Oral   Take 1 tablet (12.5 mg total) by mouth 3 (three) times daily.   30 tablet   0   . metoprolol succinate (TOPROL-XL) 25 MG 24 hr tablet   Oral   Take 12.5 mg by mouth daily.         . Multiple Vitamins-Minerals (ICAPS AREDS FORMULA PO)   Oral   Take 1 capsule by mouth 2 (two) times daily.           . nitroGLYCERIN (NITROSTAT) 0.4 MG SL tablet   Sublingual   Place 1 tablet (0.4 mg total) under the tongue every 5 (five) minutes as needed for chest pain.   25 tablet   3   . omeprazole (PRILOSEC) 20 MG capsule   Oral   Take 20 mg by mouth 2 (two) times daily.           BP 125/66  Pulse 68  Temp(Src) 98.1 F (36.7 C) (Oral)  Resp 15  SpO2 93% Physical Exam  Nursing note and vitals  reviewed. Constitutional: He is oriented to person, place, and time. He appears well-developed and well-nourished. No distress.  Elderly   HENT:  Head: Normocephalic and atraumatic.  Right Ear: External ear normal.  Left Ear: External ear normal.  Mouth/Throat: Oropharynx is clear and moist.  Eyes: Conjunctivae are normal. Right eye exhibits no discharge. Left eye exhibits no discharge. No scleral icterus.  Neck: Neck supple. No tracheal deviation present.  Cardiovascular: Normal rate, regular rhythm and intact distal pulses.   Pulmonary/Chest: Effort normal and breath sounds normal. No stridor. No respiratory distress. He has no wheezes. He has no rales.  ?hoarse sounds with deep inspiration   Abdominal: Soft. Bowel sounds are normal. He exhibits no distension. There is no tenderness. There is no rebound and no guarding.  Musculoskeletal: He exhibits no edema and no tenderness.  Neurological: He is alert and oriented to person, place, and time. He has normal strength. No cranial nerve deficit (No facial droop, extraocular movements intact, tongue midline ) or sensory deficit. He exhibits normal muscle tone. He displays no seizure activity. Coordination normal.  No pronator drift bilateral upper extrem, able to hold both legs off bed for 5 seconds, sensation intact in all extremities, no visual field cuts, no left or right sided neglect, normal finger-nose exam bilaterally, no nystagmus noted   Skin: Skin is warm and dry. No rash noted. He is not diaphoretic.  Psychiatric: He has a normal mood and affect.    ED Course  Procedures (including critical care time) Labs Review Labs Reviewed  CBC - Abnormal; Notable for the following:    RBC 4.20 (*)    All other components within normal limits  BASIC METABOLIC PANEL - Abnormal; Notable for the following:    BUN 37 (*)    Creatinine, Ser 1.83 (*)    GFR calc non Af Amer 30 (*)    GFR calc Af  Amer 35 (*)    All other components within  normal limits  URINALYSIS, ROUTINE W REFLEX MICROSCOPIC - Abnormal; Notable for the following:    APPearance CLOUDY (*)    All other components within normal limits  BLOOD GAS, ARTERIAL - Abnormal; Notable for the following:    pCO2 arterial 45.2 (*)    pO2, Arterial 71.5 (*)    Bicarbonate 24.7 (*)    All other components within normal limits   Imaging Review Dg Chest 2 View  06/24/2013   CLINICAL DATA:  History of heart stent. History of smoking. Hypertension, weakness.  EXAM: CHEST  2 VIEW  COMPARISON:  05/01/2013  FINDINGS: The heart is enlarged. Prominence of interstitial markings appear stable. There are no focal consolidations or pleural effusions. Scarring is noted in the mid lung zones and lung bases, stable in appearance. Visualized osseous structures have a normal appearance.  IMPRESSION: 1. Cardiomegaly. 2. Stable, chronic changes in the lungs. 3.  No focal acute pulmonary abnormality.   Electronically Signed   By: Rosalie Gums M.D.   On: 06/24/2013 15:31   Ct Head Wo Contrast  06/24/2013   CLINICAL DATA:  Episodes of staring today. Verbally responsive. More generally weak than normal.  EXAM: CT HEAD WITHOUT CONTRAST  TECHNIQUE: Contiguous axial images were obtained from the base of the skull through the vertex without intravenous contrast.  COMPARISON:  05/01/2013  FINDINGS: There is moderate central and cortical atrophy. Periventricular white matter changes are consistent with small vessel disease. There is no evidence for hemorrhage, mass lesion, or acute infarction. Bone windows show atherosclerotic calcification of the internal carotid arteries. There is soft tissue density within the left maxillary sinus, consistent with mucoperiosteal thickening or polyp.  IMPRESSION: 1. Atrophy and small vessel disease. 2.  No evidence for acute intracranial abnormality.   Electronically Signed   By: Rosalie Gums M.D.   On: 06/24/2013 15:29    EKG Interpretation     Ventricular Rate:  67 PR  Interval:  184 QRS Duration: 103 QT Interval:  444 QTC Calculation: 469 R Axis:   -5 Text Interpretation:  Sinus rhythm Borderline T wave abnormalities No significant change since last tracing            MDM   1. COPD (chronic obstructive pulmonary disease)   2. Chronic respiratory failure     The patient is in no distress in the emergency room. His evaluation is unremarkable with the exception of hypoxia noted on his ABG. I doubt stroke or TIA. He does have a history of COPD. Patient states he used to be on home oxygen. A few weeks ago he told the home health service to take his oxygen back because he was not using it. The patient's hypoxia certainly could have been causing episodes of fatigue and decreased alertness. Otherwise the patient is in no distress. On oxygen here his pulse ox is in the high 90s.  I will order home oxygen for the patient and otherwise he is stable for discharge    Celene Kras, MD 06/24/13 [id number]

## 2013-06-24 NOTE — ED Notes (Signed)
Bed: WA02 Expected date: 06/24/13 Expected time: 1:11 PM Means of arrival: Ambulance Comments: weakness

## 2013-07-04 ENCOUNTER — Ambulatory Visit: Payer: Medicare Other | Admitting: Internal Medicine

## 2013-08-10 HISTORY — PX: CATARACT EXTRACTION: SUR2

## 2013-09-24 ENCOUNTER — Encounter (HOSPITAL_COMMUNITY): Payer: Self-pay | Admitting: Emergency Medicine

## 2013-09-24 ENCOUNTER — Emergency Department (HOSPITAL_COMMUNITY)
Admission: EM | Admit: 2013-09-24 | Discharge: 2013-09-24 | Disposition: A | Discharge status: Home / Self Care | Payer: Medicare Other | Attending: Emergency Medicine | Admitting: Emergency Medicine

## 2013-09-24 DIAGNOSIS — K219 Gastro-esophageal reflux disease without esophagitis: Secondary | ICD-10-CM | POA: Insufficient documentation

## 2013-09-24 DIAGNOSIS — N183 Chronic kidney disease, stage 3 unspecified: Secondary | ICD-10-CM | POA: Insufficient documentation

## 2013-09-24 DIAGNOSIS — I129 Hypertensive chronic kidney disease with stage 1 through stage 4 chronic kidney disease, or unspecified chronic kidney disease: Secondary | ICD-10-CM | POA: Insufficient documentation

## 2013-09-24 DIAGNOSIS — K5641 Fecal impaction: Secondary | ICD-10-CM | POA: Insufficient documentation

## 2013-09-24 DIAGNOSIS — IMO0002 Reserved for concepts with insufficient information to code with codable children: Secondary | ICD-10-CM | POA: Insufficient documentation

## 2013-09-24 DIAGNOSIS — K5900 Constipation, unspecified: Secondary | ICD-10-CM | POA: Insufficient documentation

## 2013-09-24 DIAGNOSIS — Z9861 Coronary angioplasty status: Secondary | ICD-10-CM | POA: Insufficient documentation

## 2013-09-24 DIAGNOSIS — Z87891 Personal history of nicotine dependence: Secondary | ICD-10-CM | POA: Insufficient documentation

## 2013-09-24 DIAGNOSIS — I2510 Atherosclerotic heart disease of native coronary artery without angina pectoris: Secondary | ICD-10-CM | POA: Insufficient documentation

## 2013-09-24 DIAGNOSIS — Z8572 Personal history of non-Hodgkin lymphomas: Secondary | ICD-10-CM | POA: Insufficient documentation

## 2013-09-24 DIAGNOSIS — M109 Gout, unspecified: Secondary | ICD-10-CM | POA: Insufficient documentation

## 2013-09-24 DIAGNOSIS — Z880 Allergy status to penicillin: Secondary | ICD-10-CM | POA: Insufficient documentation

## 2013-09-24 DIAGNOSIS — Z79899 Other long term (current) drug therapy: Secondary | ICD-10-CM | POA: Insufficient documentation

## 2013-09-24 DIAGNOSIS — Z7982 Long term (current) use of aspirin: Secondary | ICD-10-CM | POA: Insufficient documentation

## 2013-09-24 MED ORDER — POLYETHYLENE GLYCOL 3350 17 G PO PACK: 17.0000 g | PACK | Freq: Every day | ORAL | Status: AC

## 2013-09-24 MED ORDER — DOCUSATE SODIUM 100 MG PO CAPS: 100.0000 mg | ORAL_CAPSULE | Freq: Two times a day (BID) | ORAL | Status: AC

## 2013-09-24 NOTE — ED Notes (Signed)
Patient is alert and oriented x3.  He was given DC instructions and follow up visit instructions.  Patient gave verbal understanding.  He was DC ambulatory under his own power to home.  V/S stable.  He was not showing any signs of distress on DC 

## 2013-09-24 NOTE — ED Provider Notes (Signed)
CSN: [id number]     Arrival date & time 09/24/13  0623 History   First MD Initiated Contact with Patient 09/24/13 0413     Chief Complaint  Patient presents with  . Fecal Impaction     (Consider location/radiation/quality/duration/timing/severity/associated sxs/prior Treatment) HPI Patient had a cataract surgery performed on Thursday and since then has had hard stool and had difficulty having a bowel movement. He normally has regular bowel movement daily. Patient states that he believes that he is impacted and try to disimpact himself fistulae earlier today without success. He denies abdominal pain, denies fevers, chills, nausea or vomiting. Past Medical History  Diagnosis Date  . Vertigo   . Lymphoma   . Hypertension   . GERD (gastroesophageal reflux disease)   . Gout   . RAS (renal artery stenosis)     PCI left renal artery 2005  . Coronary artery disease     a. s/p stent to RCA in 2000;  b. Last CLite 4/07: no ichemia, EF 73%  . CKD (chronic kidney disease) stage 3, GFR 30-59 ml/min    Past Surgical History  Procedure Laterality Date  . Stent to the righ coronary in 2000    . Lung surgery    . Coronary angioplasty with stent placement    . Appendectomy    . Tonsillectomy    . Hemorroidectomy    . Cataract extraction     Family History  Problem Relation Age of Onset  . Lung cancer Brother     heavy smoker  . Heart disease Father   . Rheum arthritis Mother    History  Substance Use Topics  . Smoking status: Former Smoker -- 0.50 packs/day for 50 years    Types: Cigarettes    Quit date: 05/03/1981  . Smokeless tobacco: Never Used     Comment: "did not inhale"  . Alcohol Use: No    Review of Systems  Constitutional: Negative for fever, chills and fatigue.  Respiratory: Negative for shortness of breath.   Cardiovascular: Negative for chest pain.  Gastrointestinal: Positive for constipation. Negative for nausea, vomiting, abdominal pain, diarrhea and abdominal  distention.  Musculoskeletal: Negative for back pain, neck pain and neck stiffness.  Skin: Negative for rash and wound.  Neurological: Negative for dizziness, weakness, light-headedness, numbness and headaches.  All other systems reviewed and are negative.      Allergies  Penicillins  Home Medications   Current Outpatient Rx  Name  Route  Sig  Dispense  Refill  . acetaminophen (TYLENOL) 500 MG tablet   Oral   Take 500 mg by mouth every 6 (six) hours as needed for pain.          Marland Kitchen allopurinol (ZYLOPRIM) 300 MG tablet   Oral   Take 300 mg by mouth every morning.          Marland Kitchen amLODipine (NORVASC) 10 MG tablet   Oral   Take 5 mg by mouth daily.         Marland Kitchen aspirin EC 81 MG tablet   Oral   Take 81 mg by mouth daily.         . cholecalciferol (VITAMIN D) 1000 UNITS tablet   Oral   Take 1,000 Units by mouth every morning.         Marland Kitchen dextromethorphan-guaiFENesin (MUCINEX DM) 30-600 MG per 12 hr tablet   Oral   Take 2 tablets by mouth 2 (two) times daily.         Marland Kitchen  fluticasone (FLONASE) 50 MCG/ACT nasal spray   Nasal   Place 2 sprays into the nose daily.   16 g   0   . furosemide (LASIX) 40 MG tablet   Oral   Take 40 mg by mouth daily.         Marland Kitchen guaiFENesin-codeine (ROBITUSSIN AC) 100-10 MG/5ML syrup   Oral   Take 5 mLs by mouth at bedtime.         Marland Kitchen HYDROcodone-acetaminophen (NORCO/VICODIN) 5-325 MG per tablet   Oral   Take 1 tablet by mouth every 6 (six) hours as needed for pain.         . meclizine (ANTIVERT) 12.5 MG tablet   Oral   Take 1 tablet (12.5 mg total) by mouth 3 (three) times daily.   30 tablet   0   . metoprolol succinate (TOPROL-XL) 25 MG 24 hr tablet   Oral   Take 12.5 mg by mouth daily.         Marland Kitchen moxifloxacin (VIGAMOX) 0.5 % ophthalmic solution   Left Eye   Place 1 drop into the left eye 4 (four) times daily.         . Multiple Vitamins-Minerals (ICAPS AREDS FORMULA PO)   Oral   Take 1 capsule by mouth 2 (two) times  daily.           . nitroGLYCERIN (NITROSTAT) 0.4 MG SL tablet   Sublingual   Place 1 tablet (0.4 mg total) under the tongue every 5 (five) minutes as needed for chest pain.   25 tablet   3   . omeprazole (PRILOSEC) 20 MG capsule   Oral   Take 20 mg by mouth 2 (two) times daily.          . prednisoLONE acetate (PRED FORTE) 1 % ophthalmic suspension   Left Eye   Place 1 drop into the left eye 4 (four) times daily.         Marland Kitchen PRESCRIPTION MEDICATION   Oral   Take 2 puffs by mouth daily. Unknown inhaler once daily         . ranitidine (ZANTAC) 150 MG tablet   Oral   Take 150 mg by mouth 2 (two) times daily.         Marland Kitchen tetrahydrozoline 0.05 % ophthalmic solution   Both Eyes   Place 1 drop into both eyes as needed (for eye irritation).          BP 138/72  Pulse 84  Temp(Src) 98 F (36.7 C) (Oral)  SpO2 92% Physical Exam  Nursing note and vitals reviewed. Constitutional: He is oriented to person, place, and time. He appears well-developed and well-nourished. No distress.  HENT:  Head: Normocephalic and atraumatic.  Mouth/Throat: Oropharynx is clear and moist.  Eyes: EOM are normal. Pupils are equal, round, and reactive to light.  Neck: Normal range of motion. Neck supple.  Cardiovascular: Normal rate and regular rhythm.   Pulmonary/Chest: Effort normal and breath sounds normal. No respiratory distress. He has no wheezes. He has no rales.  Abdominal: Soft. Bowel sounds are normal. He exhibits no distension and no mass. There is no tenderness. There is no rebound and no guarding.  Genitourinary:  Fecal impaction  Musculoskeletal: Normal range of motion. He exhibits no edema and no tenderness.  Neurological: He is alert and oriented to person, place, and time.  Moves all extremities without deficit. Sensation grossly intact.  Skin: Skin is warm and dry. No rash noted. No  erythema.  Psychiatric: He has a normal mood and affect. His behavior is normal.    ED Course   Fecal disimpaction Date/Time: 09/24/2013 5:09 AM Performed by: Julianne Rice Authorized by: Lita Mains, Carsin Randazzo Comments: Small amount of hard stool removed from the rectum digitally.    (including critical care time) Labs Review Labs Reviewed - No data to display Imaging Review No results found.  EKG Interpretation   None       MDM   Final diagnoses:  None   We'll start on stool softener and laxative. Patient is to follow-up with his primary MD to assure resolution of his symptoms. Return precautions have been given and patient voiced understanding     Julianne Rice, MD 09/24/13 [phone]

## 2013-09-24 NOTE — ED Notes (Signed)
EMS called to [facility] home independent living.  Found patient sitting dressing.  Patient states that he has not had a BM since Thursday.  Patient attempted to  Removed feces with no results.  Patient denies any rectal bleeding.  Patient denies Any pain or nausea.

## 2013-09-24 NOTE — Discharge Instructions (Signed)
Fecal Impaction A fecal impaction happens when there is a large, firm amount of stool (or feces) that cannot be passed. The impacted stool is usually in the rectum, which is the lowest part of the large bowel. The impacted stool can block the colon and cause significant problems. CAUSES  The longer stool stays in the rectum, the harder it gets. Anything that slows down your bowel movements can lead to fecal impaction, such as:  Constipation. This can be a long-standing (chronic) problem or can happen suddenly (acute).  Painful conditions of the rectum, such as hemorrhoids or anal fissures. The pain of these conditions can make you try to avoid having bowel movements.  Narcotic pain-relieving medicines, such as methadone, morphine, or codeine.  Not drinking enough fluids.  Inactivity and bed rest over long periods of time.  Diseases of the brain or nervous system that damage the nerves controlling the muscles of the intestines. SIGNS AND SYMPTOMS   Lack of normal bowel movements or changes in bowel patterns.  Sense of fullness in the rectum but unable to pass stool.  Pain or cramps in the abdominal area (often after meals).  Thin, watery discharge from the rectum. DIAGNOSIS  Your health care provider may suspect that you have a fecal impaction based on your symptoms and a physical exam. This will include an exam of your rectum. Sometimes X-rays or lab testing may be needed to confirm the diagnosis and to be sure there are no other problems.  TREATMENT   Initially an impaction can be removed manually. Using a gloved finger, your health care provider can remove hard stool from your rectum.  Medicine is sometimes needed. A suppository or enema can be given in the rectum to soften the stool, which can stimulate a bowel movement. Medicines can also be given by mouth (orally).  Though rare, surgery may be needed if the colon has torn (perforated) due to blockage. HOME CARE INSTRUCTIONS    Develop regular bowel habits. This could include getting in the habit of having a bowel movement after your morning cup of coffee or after eating. Be sure to allow yourself enough time on the toilet.  Maintain a high-fiber diet.  Drink enough fluids to keep your urine clear or pale yellow as directed by your health care provider.  Exercise regularly.  If you begin to get constipated, increase the amount of fiber in your diet. Eat plenty of fruits, vegetables, whole wheat breads, bran, oatmeal, and similar products.  Take natural fiber laxatives or other laxatives only as directed by your health care provider. SEEK MEDICAL CARE IF:   You have ongoing rectal pain.  You require enemas or suppositories more than twice a week.  You have rectal bleeding.  You have continued problems, or you develop abdominal pain.  You have thin, pencil-like stools. SEEK IMMEDIATE MEDICAL CARE IF:  You have black or tarry stools. MAKE SURE YOU:   Understand these instructions.  Will watch your condition.  Will get help right away if you are not doing well or get worse. Document Released: 04/18/2004 Document Revised: 05/17/2013 Document Reviewed: 01/31/2013 [facility] Patient Information 2014 [place].

## 2013-09-25 ENCOUNTER — Encounter (HOSPITAL_COMMUNITY): Payer: Self-pay | Admitting: Emergency Medicine

## 2013-09-25 ENCOUNTER — Inpatient Hospital Stay (HOSPITAL_COMMUNITY)
Admission: EM | Admit: 2013-09-25 | Discharge: 2013-10-08 | DRG: 388 | Disposition: E | Payer: Medicare Other | Attending: Cardiology | Admitting: Cardiology

## 2013-09-25 ENCOUNTER — Emergency Department (HOSPITAL_COMMUNITY): Payer: Medicare Other

## 2013-09-25 ENCOUNTER — Inpatient Hospital Stay (HOSPITAL_COMMUNITY): Payer: Medicare Other

## 2013-09-25 DIAGNOSIS — Z8249 Family history of ischemic heart disease and other diseases of the circulatory system: Secondary | ICD-10-CM

## 2013-09-25 DIAGNOSIS — J449 Chronic obstructive pulmonary disease, unspecified: Secondary | ICD-10-CM | POA: Diagnosis present

## 2013-09-25 DIAGNOSIS — Z9089 Acquired absence of other organs: Secondary | ICD-10-CM

## 2013-09-25 DIAGNOSIS — I509 Heart failure, unspecified: Secondary | ICD-10-CM | POA: Diagnosis present

## 2013-09-25 DIAGNOSIS — Z515 Encounter for palliative care: Secondary | ICD-10-CM | POA: Diagnosis not present

## 2013-09-25 DIAGNOSIS — R112 Nausea with vomiting, unspecified: Secondary | ICD-10-CM

## 2013-09-25 DIAGNOSIS — J9610 Chronic respiratory failure, unspecified whether with hypoxia or hypercapnia: Secondary | ICD-10-CM

## 2013-09-25 DIAGNOSIS — IMO0002 Reserved for concepts with insufficient information to code with codable children: Secondary | ICD-10-CM | POA: Diagnosis not present

## 2013-09-25 DIAGNOSIS — I2109 ST elevation (STEMI) myocardial infarction involving other coronary artery of anterior wall: Secondary | ICD-10-CM | POA: Diagnosis present

## 2013-09-25 DIAGNOSIS — I219 Acute myocardial infarction, unspecified: Secondary | ICD-10-CM

## 2013-09-25 DIAGNOSIS — I959 Hypotension, unspecified: Secondary | ICD-10-CM | POA: Diagnosis not present

## 2013-09-25 DIAGNOSIS — R570 Cardiogenic shock: Secondary | ICD-10-CM

## 2013-09-25 DIAGNOSIS — N183 Chronic kidney disease, stage 3 unspecified: Secondary | ICD-10-CM | POA: Diagnosis present

## 2013-09-25 DIAGNOSIS — Z9849 Cataract extraction status, unspecified eye: Secondary | ICD-10-CM | POA: Diagnosis not present

## 2013-09-25 DIAGNOSIS — Z87891 Personal history of nicotine dependence: Secondary | ICD-10-CM

## 2013-09-25 DIAGNOSIS — J8410 Pulmonary fibrosis, unspecified: Secondary | ICD-10-CM | POA: Diagnosis present

## 2013-09-25 DIAGNOSIS — J4489 Other specified chronic obstructive pulmonary disease: Secondary | ICD-10-CM | POA: Diagnosis present

## 2013-09-25 DIAGNOSIS — K559 Vascular disorder of intestine, unspecified: Secondary | ICD-10-CM | POA: Diagnosis present

## 2013-09-25 DIAGNOSIS — A0811 Acute gastroenteropathy due to Norwalk agent: Secondary | ICD-10-CM | POA: Diagnosis present

## 2013-09-25 DIAGNOSIS — K56600 Partial intestinal obstruction, unspecified as to cause: Secondary | ICD-10-CM | POA: Diagnosis present

## 2013-09-25 DIAGNOSIS — I498 Other specified cardiac arrhythmias: Secondary | ICD-10-CM | POA: Diagnosis not present

## 2013-09-25 DIAGNOSIS — I5021 Acute systolic (congestive) heart failure: Secondary | ICD-10-CM | POA: Diagnosis present

## 2013-09-25 DIAGNOSIS — Z880 Allergy status to penicillin: Secondary | ICD-10-CM | POA: Diagnosis not present

## 2013-09-25 DIAGNOSIS — Z8261 Family history of arthritis: Secondary | ICD-10-CM | POA: Diagnosis not present

## 2013-09-25 DIAGNOSIS — I501 Left ventricular failure: Secondary | ICD-10-CM

## 2013-09-25 DIAGNOSIS — M109 Gout, unspecified: Secondary | ICD-10-CM | POA: Diagnosis present

## 2013-09-25 DIAGNOSIS — D539 Nutritional anemia, unspecified: Secondary | ICD-10-CM | POA: Diagnosis present

## 2013-09-25 DIAGNOSIS — Z87898 Personal history of other specified conditions: Secondary | ICD-10-CM | POA: Diagnosis not present

## 2013-09-25 DIAGNOSIS — R0601 Orthopnea: Secondary | ICD-10-CM | POA: Diagnosis not present

## 2013-09-25 DIAGNOSIS — I129 Hypertensive chronic kidney disease with stage 1 through stage 4 chronic kidney disease, or unspecified chronic kidney disease: Secondary | ICD-10-CM | POA: Diagnosis present

## 2013-09-25 DIAGNOSIS — I701 Atherosclerosis of renal artery: Secondary | ICD-10-CM

## 2013-09-25 DIAGNOSIS — Z66 Do not resuscitate: Secondary | ICD-10-CM | POA: Diagnosis not present

## 2013-09-25 DIAGNOSIS — Z808 Family history of malignant neoplasm of other organs or systems: Secondary | ICD-10-CM | POA: Diagnosis not present

## 2013-09-25 DIAGNOSIS — K561 Intussusception: Secondary | ICD-10-CM | POA: Diagnosis not present

## 2013-09-25 DIAGNOSIS — R42 Dizziness and giddiness: Secondary | ICD-10-CM | POA: Diagnosis not present

## 2013-09-25 DIAGNOSIS — I2510 Atherosclerotic heart disease of native coronary artery without angina pectoris: Secondary | ICD-10-CM

## 2013-09-25 DIAGNOSIS — K56609 Unspecified intestinal obstruction, unspecified as to partial versus complete obstruction: Secondary | ICD-10-CM

## 2013-09-25 DIAGNOSIS — I2589 Other forms of chronic ischemic heart disease: Secondary | ICD-10-CM | POA: Diagnosis present

## 2013-09-25 DIAGNOSIS — Z9861 Coronary angioplasty status: Secondary | ICD-10-CM | POA: Diagnosis not present

## 2013-09-25 DIAGNOSIS — R7989 Other specified abnormal findings of blood chemistry: Secondary | ICD-10-CM

## 2013-09-25 DIAGNOSIS — Z801 Family history of malignant neoplasm of trachea, bronchus and lung: Secondary | ICD-10-CM | POA: Diagnosis not present

## 2013-09-25 DIAGNOSIS — K5669 Other intestinal obstruction: Secondary | ICD-10-CM | POA: Diagnosis present

## 2013-09-25 DIAGNOSIS — K219 Gastro-esophageal reflux disease without esophagitis: Secondary | ICD-10-CM | POA: Diagnosis present

## 2013-09-25 DIAGNOSIS — E785 Hyperlipidemia, unspecified: Secondary | ICD-10-CM

## 2013-09-25 DIAGNOSIS — R778 Other specified abnormalities of plasma proteins: Secondary | ICD-10-CM

## 2013-09-25 DIAGNOSIS — I10 Essential (primary) hypertension: Secondary | ICD-10-CM | POA: Diagnosis present

## 2013-09-25 HISTORY — DX: Chronic obstructive pulmonary disease, unspecified: J44.9

## 2013-09-25 HISTORY — DX: Angina pectoris, unspecified: I20.9

## 2013-09-25 LAB — COMPREHENSIVE METABOLIC PANEL
ALBUMIN: 3.4 g/dL — AB (ref 3.5–5.2)
ALK PHOS: 101 U/L (ref 39–117)
ALT: 16 U/L (ref 0–53)
AST: 86 U/L — AB (ref 0–37)
BUN: 19 mg/dL (ref 6–23)
CALCIUM: 9.5 mg/dL (ref 8.4–10.5)
CO2: 28 mEq/L (ref 19–32)
Chloride: 99 mEq/L (ref 96–112)
Creatinine, Ser: 1.39 mg/dL — ABNORMAL HIGH (ref 0.50–1.35)
GFR calc Af Amer: 49 mL/min — ABNORMAL LOW (ref >90)
GFR calc non Af Amer: 42 mL/min — ABNORMAL LOW (ref >90)
Glucose, Bld: 131 mg/dL — ABNORMAL HIGH (ref 70–99)
POTASSIUM: 4 meq/L (ref 3.7–5.3)
SODIUM: 142 meq/L (ref 137–147)
TOTAL PROTEIN: 7.1 g/dL (ref 6.0–8.3)
Total Bilirubin: 0.5 mg/dL (ref 0.3–1.2)

## 2013-09-25 LAB — LIPID PANEL
CHOL/HDL RATIO: 2.3 ratio
Cholesterol: 196 mg/dL (ref 0–200)
HDL: 84 mg/dL (ref >39)
LDL CALC: 93 mg/dL (ref 0–99)
Triglycerides: 95 mg/dL (ref <150)
VLDL: 19 mg/dL (ref 0–40)

## 2013-09-25 LAB — CBC WITH DIFFERENTIAL/PLATELET
BASOS ABS: 0 10*3/uL (ref 0.0–0.1)
Basophils Relative: 0 % (ref 0–1)
EOS ABS: 0 10*3/uL (ref 0.0–0.7)
EOS PCT: 0 % (ref 0–5)
HCT: 40.5 % (ref 39.0–52.0)
Hemoglobin: 12.9 g/dL — ABNORMAL LOW (ref 13.0–17.0)
LYMPHS ABS: 0.4 10*3/uL — AB (ref 0.7–4.0)
Lymphocytes Relative: 9 % — ABNORMAL LOW (ref 12–46)
MCH: 33.6 pg (ref 26.0–34.0)
MCHC: 31.9 g/dL (ref 30.0–36.0)
MCV: 105.5 fL — AB (ref 78.0–100.0)
Monocytes Absolute: 0.5 10*3/uL (ref 0.1–1.0)
Monocytes Relative: 9 % (ref 3–12)
NEUTROS PCT: 82 % — AB (ref 43–77)
Neutro Abs: 4 10*3/uL (ref 1.7–7.7)
Platelets: 170 10*3/uL (ref 150–400)
RBC: 3.84 MIL/uL — AB (ref 4.22–5.81)
RDW: 15.2 % (ref 11.5–15.5)
WBC: 4.9 10*3/uL (ref 4.0–10.5)

## 2013-09-25 LAB — APTT: aPTT: 34 seconds (ref 24–37)

## 2013-09-25 LAB — URINALYSIS, ROUTINE W REFLEX MICROSCOPIC
Bilirubin Urine: NEGATIVE
Glucose, UA: NEGATIVE mg/dL
KETONES UR: NEGATIVE mg/dL
Leukocytes, UA: NEGATIVE
NITRITE: NEGATIVE
Specific Gravity, Urine: 1.024 (ref 1.005–1.030)
Urobilinogen, UA: 0.2 mg/dL (ref 0.0–1.0)
pH: 6 (ref 5.0–8.0)

## 2013-09-25 LAB — OCCULT BLOOD, POC DEVICE: Fecal Occult Bld: POSITIVE — AB

## 2013-09-25 LAB — URINE MICROSCOPIC-ADD ON

## 2013-09-25 LAB — LIPASE, BLOOD: Lipase: 9 U/L — ABNORMAL LOW (ref 11–59)

## 2013-09-25 LAB — POCT I-STAT TROPONIN I: Troponin i, poc: 12.51 ng/mL (ref 0.00–0.08)

## 2013-09-25 LAB — CG4 I-STAT (LACTIC ACID): Lactic Acid, Venous: 1.45 mmol/L (ref 0.5–2.2)

## 2013-09-25 LAB — PROTIME-INR
INR: 0.91 (ref 0.00–1.49)
PROTHROMBIN TIME: 12.1 s (ref 11.6–15.2)

## 2013-09-25 LAB — TROPONIN I: TROPONIN I: 15.94 ng/mL — AB (ref <0.30)

## 2013-09-25 MED ORDER — GATIFLOXACIN 0.5 % OP SOLN
1.0000 [drp] | Freq: Four times a day (QID) | OPHTHALMIC | Status: DC
  Administered 2013-09-26 – 2013-09-27 (×8): 1 [drp] via OPHTHALMIC
  Filled 2013-09-25: qty 2.5

## 2013-09-25 MED ORDER — ONDANSETRON HCL 4 MG/2ML IJ SOLN
4.0000 mg | Freq: Once | INTRAMUSCULAR | Status: AC
  Administered 2013-09-25: 4 mg via INTRAVENOUS
  Filled 2013-09-25: qty 2

## 2013-09-25 MED ORDER — IOHEXOL 300 MG/ML  SOLN
50.0000 mL | Freq: Once | INTRAMUSCULAR | Status: AC | PRN
  Administered 2013-09-25: 50 mL via ORAL

## 2013-09-25 MED ORDER — METOPROLOL TARTRATE 1 MG/ML IV SOLN
5.0000 mg | Freq: Four times a day (QID) | INTRAVENOUS | Status: DC | PRN
  Administered 2013-09-25: 5 mg via INTRAVENOUS
  Filled 2013-09-25: qty 5

## 2013-09-25 MED ORDER — ASPIRIN EC 81 MG PO TBEC
81.0000 mg | DELAYED_RELEASE_TABLET | Freq: Every day | ORAL | Status: DC
  Administered 2013-09-26: 81 mg via ORAL
  Filled 2013-09-25 (×2): qty 1

## 2013-09-25 MED ORDER — IOHEXOL 300 MG/ML  SOLN
80.0000 mL | Freq: Once | INTRAMUSCULAR | Status: AC | PRN
  Administered 2013-09-25: 80 mL via INTRAVENOUS

## 2013-09-25 MED ORDER — FAMOTIDINE IN NACL 20-0.9 MG/50ML-% IV SOLN
20.0000 mg | Freq: Every day | INTRAVENOUS | Status: DC
  Administered 2013-09-26 (×2): 20 mg via INTRAVENOUS
  Filled 2013-09-25 (×2): qty 50

## 2013-09-25 MED ORDER — ASPIRIN 300 MG RE SUPP
300.0000 mg | Freq: Once | RECTAL | Status: AC
  Administered 2013-09-25: 300 mg via RECTAL
  Filled 2013-09-25: qty 1

## 2013-09-25 MED ORDER — ASPIRIN 81 MG PO CHEW: 324.0000 mg | CHEWABLE_TABLET | Freq: Once | ORAL | Status: DC

## 2013-09-25 MED ORDER — METOPROLOL TARTRATE 1 MG/ML IV SOLN: 5.0000 mg | Freq: Four times a day (QID) | INTRAVENOUS | Status: DC | PRN

## 2013-09-25 MED ORDER — NITROGLYCERIN 0.4 MG SL SUBL: 0.4000 mg | SUBLINGUAL_TABLET | SUBLINGUAL | Status: DC | PRN

## 2013-09-25 MED ORDER — HEPARIN (PORCINE) IN NACL 100-0.45 UNIT/ML-% IJ SOLN
1800.0000 [IU]/h | INTRAMUSCULAR | Status: DC
  Administered 2013-09-25: 1150 [IU]/h via INTRAVENOUS
  Administered 2013-09-26: 1400 [IU]/h via INTRAVENOUS
  Administered 2013-09-27: 1800 [IU]/h via INTRAVENOUS
  Administered 2013-09-27: 1700 [IU]/h via INTRAVENOUS
  Filled 2013-09-25 (×5): qty 250

## 2013-09-25 MED ORDER — HEPARIN BOLUS VIA INFUSION
2000.0000 [IU] | Freq: Once | INTRAVENOUS | Status: AC
  Administered 2013-09-25: 2000 [IU] via INTRAVENOUS
  Filled 2013-09-25: qty 2000

## 2013-09-25 MED ORDER — ONDANSETRON HCL 4 MG/2ML IJ SOLN: 4.0000 mg | Freq: Three times a day (TID) | INTRAMUSCULAR | Status: DC | PRN

## 2013-09-25 MED ORDER — ONDANSETRON HCL 4 MG PO TABS: 4.0000 mg | ORAL_TABLET | Freq: Four times a day (QID) | ORAL | Status: DC | PRN

## 2013-09-25 MED ORDER — SODIUM CHLORIDE 0.9 % IJ SOLN: 3.0000 mL | INTRAMUSCULAR | Status: DC | PRN

## 2013-09-25 MED ORDER — NITROGLYCERIN 0.4 MG SL SUBL
0.4000 mg | SUBLINGUAL_TABLET | SUBLINGUAL | Status: DC | PRN
  Administered 2013-09-25: 0.4 mg via SUBLINGUAL
  Filled 2013-09-25: qty 25

## 2013-09-25 MED ORDER — SODIUM CHLORIDE 0.9 % IV SOLN
Freq: Once | INTRAVENOUS | Status: AC
  Administered 2013-09-25: 20:00:00 via INTRAVENOUS

## 2013-09-25 MED ORDER — ASPIRIN 300 MG RE SUPP
300.0000 mg | RECTAL | Status: AC
  Filled 2013-09-25: qty 1

## 2013-09-25 MED ORDER — PREDNISOLONE ACETATE 1 % OP SUSP
1.0000 [drp] | Freq: Four times a day (QID) | OPHTHALMIC | Status: DC
  Administered 2013-09-26 – 2013-09-27 (×8): 1 [drp] via OPHTHALMIC
  Filled 2013-09-25: qty 1

## 2013-09-25 MED ORDER — METOPROLOL TARTRATE 1 MG/ML IV SOLN
5.0000 mg | Freq: Four times a day (QID) | INTRAVENOUS | Status: DC
Start: 2013-09-26 — End: 2013-09-28
  Administered 2013-09-26 – 2013-09-27 (×3): 5 mg via INTRAVENOUS
  Filled 2013-09-25 (×9): qty 5

## 2013-09-25 MED ORDER — DEXTROSE-NACL 5-0.45 % IV SOLN
INTRAVENOUS | Status: DC
  Administered 2013-09-25: 75 mL via INTRAVENOUS
  Administered 2013-09-26: 17:00:00 via INTRAVENOUS

## 2013-09-25 MED ORDER — CIPROFLOXACIN IN D5W 400 MG/200ML IV SOLN
400.0000 mg | Freq: Two times a day (BID) | INTRAVENOUS | Status: DC
  Administered 2013-09-25 – 2013-09-27 (×4): 400 mg via INTRAVENOUS
  Filled 2013-09-25 (×7): qty 200

## 2013-09-25 MED ORDER — PANTOPRAZOLE SODIUM 40 MG IV SOLR
40.0000 mg | Freq: Two times a day (BID) | INTRAVENOUS | Status: DC
  Administered 2013-09-26 – 2013-09-27 (×4): 40 mg via INTRAVENOUS
  Filled 2013-09-25 (×5): qty 40

## 2013-09-25 MED ORDER — BISACODYL 10 MG RE SUPP: 10.0000 mg | Freq: Every day | RECTAL | Status: DC | PRN

## 2013-09-25 MED ORDER — ASPIRIN 81 MG PO CHEW: 324.0000 mg | CHEWABLE_TABLET | ORAL | Status: AC

## 2013-09-25 MED ORDER — SODIUM CHLORIDE 0.9 % IV BOLUS (SEPSIS)
1000.0000 mL | Freq: Once | INTRAVENOUS | Status: AC
  Administered 2013-09-25: 1000 mL via INTRAVENOUS

## 2013-09-25 MED ORDER — HYDROMORPHONE HCL PF 1 MG/ML IJ SOLN
0.5000 mg | INTRAMUSCULAR | Status: DC | PRN
  Administered 2013-09-26: 0.5 mg via INTRAVENOUS
  Filled 2013-09-25: qty 1

## 2013-09-25 MED ORDER — IPRATROPIUM-ALBUTEROL 0.5-2.5 (3) MG/3ML IN SOLN: 3.0000 mL | RESPIRATORY_TRACT | Status: DC | PRN

## 2013-09-25 MED ORDER — NITROGLYCERIN 0.4 MG SL SUBL
0.4000 mg | SUBLINGUAL_TABLET | SUBLINGUAL | Status: DC | PRN
  Filled 2013-09-25: qty 25

## 2013-09-25 MED ORDER — ONDANSETRON HCL 4 MG/2ML IJ SOLN
4.0000 mg | Freq: Four times a day (QID) | INTRAMUSCULAR | Status: DC | PRN
  Administered 2013-09-26 (×2): 4 mg via INTRAVENOUS
  Filled 2013-09-25 (×2): qty 2

## 2013-09-25 MED ORDER — FLUTICASONE PROPIONATE 50 MCG/ACT NA SUSP
2.0000 | Freq: Every day | NASAL | Status: DC
  Administered 2013-09-27: 2 via NASAL
  Filled 2013-09-25: qty 16

## 2013-09-25 MED ORDER — METRONIDAZOLE IN NACL 5-0.79 MG/ML-% IV SOLN
500.0000 mg | Freq: Three times a day (TID) | INTRAVENOUS | Status: DC
  Administered 2013-09-25 – 2013-09-27 (×7): 500 mg via INTRAVENOUS
  Filled 2013-09-25 (×9): qty 100

## 2013-09-25 MED ORDER — SODIUM CHLORIDE 0.9 % IV SOLN: 250.0000 mL | INTRAVENOUS | Status: DC | PRN

## 2013-09-25 MED ORDER — SODIUM CHLORIDE 0.9 % IJ SOLN
3.0000 mL | Freq: Two times a day (BID) | INTRAMUSCULAR | Status: DC
  Administered 2013-09-26 – 2013-09-27 (×2): 3 mL via INTRAVENOUS

## 2013-09-25 NOTE — H&P (Addendum)
[facility] History and Physical  Kaeleb Purnell Boesch [id number] DOB: Apr 06, 1919 DOA: 09/15/2013  Referring physician:  Jeannett Senior, PA PCP:  Donnie Coffin, MD   Chief Complaint:  Nausea, vomiting, abdominal pain  HPI:  The patient is a 80 y.o. year-old male with history of CAD s/p stent to RCA, diastolic heart failure, HTN, COPD on 2L O2 prn, CKD stage 3, gout, renal artery stenosis, and isolated lesion of lymphoma on his lip, who presents with nausea, vomiting, abdominal pain, and diarrhea.  The patient was last at their baseline health about 5 days ago.  After his cataract surgery, he took some narcotic pain medication which made him constipated.  He presented to the ER yesterday for stool disimpaction after which he had several bowel movements which progressively became more diarrhea-like.  His abdominal cramping improved.  He went home and took miralax before bed, however, he woke up one hour later with nausea, vomiting, and diarrhea.  Since then he has had 3 more episodes of nonbilious, nonbloody vomiting and brown loose stool.  He had some streaks of blood on his stools initially which he attributed to rectal trauma from disimpaction.  He also had some aching lower right quadrant abdominal pain 8/10 which has not improved.  He has not been able to tolerate food or drink so he came to the ER.   In the ER, labs were notable for mild anemia and baseline CKD.  CXR/KUB demonstrated partial small bowel obstruction, no free air, stable cardiomegaly with stable interstitial pulmonary fibrosis.  CT scan of the abd/pelvis is pending.    He had appendectomy about 80 years ago and no other abdominal surgeries.  Denies colon cancer or polyps, last colonoscopy was many years ago.    Review of Systems:  General:  Denies fevers, chills, weight loss or gain HEENT:  Denies changes to hearing and vision, rhinorrhea, sinus congestion, sore throat.  Has dry throat now CV:  Denies chest pain and  palpitations, lower extremity edema.  PULM:  Chronic SOB, wheezing, cough.   GI:  Per HPI GU:  Denies dysuria, frequency, urgency ENDO:  Denies polyuria, polydipsia.   HEME:  Denies hematemesis, blood in stools, melena, abnormal bruising or bleeding.  LYMPH:  Denies lymphadenopathy.   MSK:  Denies arthralgias, myalgias.   DERM:  Denies skin rash or ulcer.   NEURO:  Denies focal numbness, weakness, slurred speech, confusion, facial droop.  PSYCH:  Denies anxiety and depression.    Past Medical History  Diagnosis Date  . Vertigo   . 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 Hypertension   . GERD (gastroesophageal reflux disease)   . Gout   . RAS (renal artery stenosis)     PCI left renal artery 2005  . Coronary artery disease     a. s/p stent to RCA in 2000;  b. Last CLite 4/07: no ichemia, EF 73%  . CKD (chronic kidney disease) stage 3, GFR 30-59 ml/min   . Anginal pain   . COPD (chronic obstructive pulmonary disease)     2L O2 prn   Past Surgical History  Procedure Laterality Date  . Stent to the righ coronary in 2000    . Lung surgery      benign spot from lung by Dr. Arlyce Dice  . Coronary angioplasty with stent placement    . Appendectomy    . Tonsillectomy    . Hemorroidectomy    . Cataract extraction Left   .  Mouth surgery      area removed from mouth - lymphoma per pt - Dr Lucia Gaskins was dr.   Lavena Bullion History:  reports that he quit smoking about 32 years ago. His smoking use included Cigarettes. He has a 20 pack-year smoking history. He has never used smokeless tobacco. He reports that he drinks alcohol. He reports that he does not use illicit drugs. Lives at [facility] independent living.  Allergies  Allergen Reactions  . Penicillins Hives and Rash    Family History  Problem Relation Age of Onset  . Lung cancer Brother     heavy smoker  . Heart disease Father   . Congestive Heart Failure Father   . Rheum arthritis Mother   . Brain cancer  Brother      Prior to Admission medications   Medication Sig Start Date End Date Taking? Authorizing Provider  acetaminophen (TYLENOL) 500 MG tablet Take 500 mg by mouth every 6 (six) hours as needed for pain.    Yes Historical Provider, MD  allopurinol (ZYLOPRIM) 300 MG tablet Take 300 mg by mouth every morning.    Yes Historical Provider, MD  amLODipine (NORVASC) 10 MG tablet Take 5 mg by mouth daily.   Yes Historical Provider, MD  aspirin EC 81 MG tablet Take 81 mg by mouth daily.   Yes Historical Provider, MD  cholecalciferol (VITAMIN D) 1000 UNITS tablet Take 1,000 Units by mouth every morning.   Yes Historical Provider, MD  dextromethorphan-guaiFENesin (MUCINEX DM) 30-600 MG per 12 hr tablet Take 2 tablets by mouth 2 (two) times daily.   Yes Historical Provider, MD  docusate sodium (COLACE) 100 MG capsule Take 1 capsule (100 mg total) by mouth every 12 (twelve) hours. 09/24/13  Yes Julianne Rice, MD  fluticasone [facility]) 50 MCG/ACT nasal spray Place 2 sprays into the nose daily. 02/21/13  Yes Adeline Saralyn Pilar, MD  furosemide (LASIX) 40 MG tablet Take 40 mg by mouth daily. 02/21/13  Yes Adeline C Viyuoh, MD  guaiFENesin-codeine (ROBITUSSIN AC) 100-10 MG/5ML syrup Take 5 mLs by mouth at bedtime.   Yes Historical Provider, MD  HYDROcodone-acetaminophen (NORCO/VICODIN) 5-325 MG per tablet Take 1 tablet by mouth every 6 (six) hours as needed for pain.   Yes Historical Provider, MD  meclizine (ANTIVERT) 12.5 MG tablet Take 1 tablet (12.5 mg total) by mouth 3 (three) times daily. 05/03/13  Yes Costin Karlyne Greenspan, MD  metoprolol succinate (TOPROL-XL) 25 MG 24 hr tablet Take 12.5 mg by mouth daily.   Yes Historical Provider, MD  moxifloxacin (VIGAMOX) 0.5 % ophthalmic solution Place 1 drop into the left eye 4 (four) times daily.   Yes Historical Provider, MD  Multiple Vitamins-Minerals (ICAPS AREDS FORMULA PO) Take 1 capsule by mouth 2 (two) times daily.     Yes Historical Provider, MD  nitroGLYCERIN  (NITROSTAT) 0.4 MG SL tablet Place 1 tablet (0.4 mg total) under the tongue every 5 (five) minutes as needed for chest pain. 06/15/13  Yes Minus Breeding, MD  omeprazole (PRILOSEC) 20 MG capsule Take 20 mg by mouth 2 (two) times daily.    Yes Historical Provider, MD  polyethylene glycol (MIRALAX / GLYCOLAX) packet Take 17 g by mouth daily. 09/24/13  Yes Julianne Rice, MD  prednisoLONE acetate (PRED FORTE) 1 % ophthalmic suspension Place 1 drop into the left eye 4 (four) times daily.   Yes Historical Provider, MD  PRESCRIPTION MEDICATION Take 2 puffs by mouth daily. Unknown inhaler once daily   Yes Historical Provider, MD  ranitidine (ZANTAC) 150 MG tablet Take 150 mg by mouth 2 (two) times daily.   Yes Historical Provider, MD  tetrahydrozoline 0.05 % ophthalmic solution Place 1 drop into both eyes as needed (for eye irritation).   Yes Historical Provider, MD   Physical Exam: Filed Vitals:   09/12/2013 1514 09/21/2013 1529  BP:  121/73  Pulse:  106  Temp:  98.6 F (37 C)  TempSrc:  Oral  Resp:  18  SpO2: 100% 98%     General:  CM, NAD  Eyes:  PERRL, anicteric, non-injected, glasses  ENT:  Nares clear.  OP clear, non-erythematous without plaques or exudates.  Dry MM.  Neck:  Supple without TM or JVD.    Lymph:  No cervical, supraclavicular, or submandibular LAD.  Cardiovascular:  IRRR, normal S1, S2, without m/r/g.  2+ pulses, warm extremities  Respiratory:   Faint rales right base, diminished bilateral BS without wheezes or rhonchi, without increased WOB.  Abdomen:  High pitched and hyperactive BS, Soft, mildly distended TTP just to the right and inferior to the umbilicus without rebound, voluntary guarding    Skin:  No rashes or focal lesions.  Musculoskeletal:  Normal bulk and tone.  No LE edema.  Psychiatric:  A & O x 4.  Appropriate affect.  Neurologic:  CN 3-12 intact.  5/5 strength.  Sensation intact.  Labs on Admission:  Basic Metabolic Panel:  Recent Labs Lab  09/22/2013 1527  NA 142  K 4.0  CL 99  CO2 28  GLUCOSE 131*  BUN 19  CREATININE 1.39*  CALCIUM 9.5   Liver Function Tests:  Recent Labs Lab 09/17/2013 1527  AST 86*  ALT 16  ALKPHOS 101  BILITOT 0.5  PROT 7.1  ALBUMIN 3.4*    Recent Labs Lab 09/17/2013 1527  LIPASE 9*   No results found for this basename: AMMONIA,  in the last 168 hours CBC:  Recent Labs Lab 09/21/2013 1527  WBC 4.9  NEUTROABS 4.0  HGB 12.9*  HCT 40.5  MCV 105.5*  PLT 170   Cardiac Enzymes: No results found for this basename: CKTOTAL, CKMB, CKMBINDEX, TROPONINI,  in the last 168 hours  BNP (last 3 results)  Recent Labs  02/17/13 1251 02/18/13 1852  PROBNP 296.0* 1508.0*   CBG: No results found for this basename: GLUCAP,  in the last 168 hours  Radiological Exams on Admission: Dg Abd Acute W/chest  10/02/2013   CLINICAL DATA:  Abdominal pain.  Nausea.  Shortness of breath.  EXAM: ACUTE ABDOMEN SERIES (ABDOMEN 2 VIEW & CHEST 1 VIEW)  COMPARISON:  DG CHEST 2 VIEW dated 06/24/2013; DG CHEST 2 VIEW dated 05/01/2013; DG CHEST 2 VIEW dated 02/28/2013; DG CHEST 2 VIEW dated 02/20/2013; CT ABD/PELVIS W CM dated 02/05/2010  FINDINGS: Gaseous distention of multiple loops of small bowel throughout the abdomen, demonstrating air-fluid levels on the erect image. No evidence of free intraperitoneal air. Gas and liquid stool throughout normal caliber colon. Left renal stent. Numerous pelvic phleboliths. Calcification projected to the left of the L2-3 level is too lateral to represent a urinary tract calculus. No definite opaque urinary tract calculi. Left renal artery stent. Degenerative changes involving the thoracic and lumbar spine.  Cardiac silhouette mildly to moderately enlarged but stable. Diffuse interstitial pulmonary fibrosis and low lung volumes, unchanged. No new pulmonary parenchymal abnormalities.  IMPRESSION: 1. Partial small bowel obstruction.  No free intraperitoneal air. 2. Stable cardiomegaly. Stable  interstitial pulmonary fibrosis with associated low lung volumes. No acute  cardiopulmonary disease.   Electronically Signed   By: Evangeline Dakin M.D.   On: 09/16/2013 16:21    EKG:  Q waves but also ST segment elevations in lateral leads which are new, other than weakness, patient otherwise asymptomatic.    Assessment/Plan Principal Problem:   Partial small bowel obstruction Active Problems:   Hypertension   CKD (chronic kidney disease) stage 3, GFR 30-59 ml/min   Nausea and vomiting   CAD (coronary artery disease)  ---  Partial small bowel obstruction secondary to intussusception, possible ischemic colitis with "air bubbles in the adjacent mesentary" that could also represent perforation (less likely) or air trapped within intussusception.  Lactic acid 1.45.  Ddx includes partial bowel obstruction, diverticulitis, gastroenteritis.    -  NPO with IVF -  NG  -  cipro/flagyl -  C. Diff, GI path, Stool culture -  Antiemetics and dilaudid prn pain -  General surgery consultation  CAD, Possible STEMI, initial POC trop 12. ECG with Q waves but also ST segment elevations in lateral leads which are new, other than weakness, patient otherwise asymptomatic.   -  Rectal ASA once -  IV BB prn -  NTG prn chest pain -  Telemetry:  Premature atrial beats and sinus tachycardia -  Cycle troponins -  A1c and lipid panel -  Cardiology consulted for possible emergent cath  HTN, blood pressure stable -  Hold oral medications for now  GERD, stable.  IV PPI and H2 blocker  COPD, stable.  Duonebs and O2 prn  CKD stage 3 and renal artery stenosis, creatinine at baseline.  Minimize nephrotoxins, renally dose medications.   -  Will need prehydration prior to possible catheterization/surgery  Gout, stable.  Restart allopurinol ASAP  Vertigo, stable.  Hold meclizine  Diet:  NPO Access:  PIV IVF:  yes Proph:  lovenox  Code Status: Partial Family Communication: patient alone Disposition  Plan: Admit to stepdown either at [facility] or [facility]  Time spent: 60 min Terilynn Buresh, [facility] Pager [phone]  If 7PM-7AM, please contact night-coverage [email or web address] Password [email or web address] , 6:18 PM

## 2013-09-25 NOTE — ED Provider Notes (Signed)
CSN: [id number]     Arrival date & time 09/23/2013  1508 History   First MD Initiated Contact with Patient 10/03/2013 [phone]     Chief Complaint  Patient presents with  . Abdominal Pain     (Consider location/radiation/quality/duration/timing/severity/associated sxs/prior Treatment) HPI [name] is a 80 y.o. male present to emergency department complaining of nausea, vomiting, diarrhea, abdominal pain. Patient states that he was seen here yesterday for constipation. At that time he has not had a bowel movement for 4 days. He was found to be impacted and was digitally disimpacted in emergency department. He states he took MiraLax with orange juice when he got home, and states he had 2 large bowel movements at home. He states shortly after that he began to have multiple episodes of emesis and more bowel movements which were watery. He states he last vomited last night. States continued to have nausea and upper abdominal pain. He states he has not been able to eat or drink any today because food is making him nauseated. He denies any known fever, no chills. Denies any blood in his emesis or stool. States last bowel movement was this morning around 10 AM which was loose. Denies chest pain or shortness of breath. Denies back pain.  Past Medical History  Diagnosis Date  . Vertigo   . Lymphoma   . Hypertension   . GERD (gastroesophageal reflux disease)   . Gout   . RAS (renal artery stenosis)     PCI left renal artery 2005  . Coronary artery disease     a. s/p stent to RCA in 2000;  b. Last CLite 4/07: no ichemia, EF 73%  . CKD (chronic kidney disease) stage 3, GFR 30-59 ml/min    Past Surgical History  Procedure Laterality Date  . Stent to the righ coronary in 2000    . Lung surgery    . Coronary angioplasty with stent placement    . Appendectomy    . Tonsillectomy    . Hemorroidectomy    . Cataract extraction     Family History  Problem Relation Age of Onset  . Lung cancer Brother      heavy smoker  . Heart disease Father   . Rheum arthritis Mother    History  Substance Use Topics  . Smoking status: Former Smoker -- 0.50 packs/day for 50 years    Types: Cigarettes    Quit date: 05/03/1981  . Smokeless tobacco: Never Used     Comment: "did not inhale"  . Alcohol Use: No    Review of Systems  Constitutional: Negative for fever and chills.  Respiratory: Negative for cough, chest tightness and shortness of breath.   Cardiovascular: Negative for chest pain, palpitations and leg swelling.  Gastrointestinal: Positive for nausea, vomiting, abdominal pain and diarrhea. Negative for blood in stool and abdominal distention.  Genitourinary: Negative for dysuria, urgency, frequency and hematuria.  Musculoskeletal: Negative for myalgias, neck pain and neck stiffness.  Skin: Negative for rash.  Allergic/Immunologic: Negative for immunocompromised state.  Neurological: Negative for dizziness, weakness, light-headedness, numbness and headaches.  All other systems reviewed and are negative.      Allergies  Penicillins  Home Medications   Current Outpatient Rx  Name  Route  Sig  Dispense  Refill  . acetaminophen (TYLENOL) 500 MG tablet   Oral   Take 500 mg by mouth every 6 (six) hours as needed for pain.          Marland Kitchen  allopurinol (ZYLOPRIM) 300 MG tablet   Oral   Take 300 mg by mouth every morning.          Marland Kitchen amLODipine (NORVASC) 10 MG tablet   Oral   Take 5 mg by mouth daily.         Marland Kitchen aspirin EC 81 MG tablet   Oral   Take 81 mg by mouth daily.         . cholecalciferol (VITAMIN D) 1000 UNITS tablet   Oral   Take 1,000 Units by mouth every morning.         Marland Kitchen dextromethorphan-guaiFENesin (MUCINEX DM) 30-600 MG per 12 hr tablet   Oral   Take 2 tablets by mouth 2 (two) times daily.         Marland Kitchen docusate sodium (COLACE) 100 MG capsule   Oral   Take 1 capsule (100 mg total) by mouth every 12 (twelve) hours.   60 capsule   0   . fluticasone  (FLONASE) 50 MCG/ACT nasal spray   Nasal   Place 2 sprays into the nose daily.   16 g   0   . furosemide (LASIX) 40 MG tablet   Oral   Take 40 mg by mouth daily.         Marland Kitchen guaiFENesin-codeine (ROBITUSSIN AC) 100-10 MG/5ML syrup   Oral   Take 5 mLs by mouth at bedtime.         Marland Kitchen HYDROcodone-acetaminophen (NORCO/VICODIN) 5-325 MG per tablet   Oral   Take 1 tablet by mouth every 6 (six) hours as needed for pain.         . meclizine (ANTIVERT) 12.5 MG tablet   Oral   Take 1 tablet (12.5 mg total) by mouth 3 (three) times daily.   30 tablet   0   . metoprolol succinate (TOPROL-XL) 25 MG 24 hr tablet   Oral   Take 12.5 mg by mouth daily.         Marland Kitchen moxifloxacin (VIGAMOX) 0.5 % ophthalmic solution   Left Eye   Place 1 drop into the left eye 4 (four) times daily.         . Multiple Vitamins-Minerals (ICAPS AREDS FORMULA PO)   Oral   Take 1 capsule by mouth 2 (two) times daily.           . nitroGLYCERIN (NITROSTAT) 0.4 MG SL tablet   Sublingual   Place 1 tablet (0.4 mg total) under the tongue every 5 (five) minutes as needed for chest pain.   25 tablet   3   . omeprazole (PRILOSEC) 20 MG capsule   Oral   Take 20 mg by mouth 2 (two) times daily.          . polyethylene glycol (MIRALAX / GLYCOLAX) packet   Oral   Take 17 g by mouth daily.   14 each   0   . prednisoLONE acetate (PRED FORTE) 1 % ophthalmic suspension   Left Eye   Place 1 drop into the left eye 4 (four) times daily.         Marland Kitchen PRESCRIPTION MEDICATION   Oral   Take 2 puffs by mouth daily. Unknown inhaler once daily         . ranitidine (ZANTAC) 150 MG tablet   Oral   Take 150 mg by mouth 2 (two) times daily.         Marland Kitchen tetrahydrozoline 0.05 % ophthalmic solution   Both Eyes  Place 1 drop into both eyes as needed (for eye irritation).          SpO2 100% Physical Exam  Nursing note and vitals reviewed. Constitutional: He is oriented to person, place, and time. He appears  well-developed and well-nourished. No distress.  HENT:  Head: Normocephalic and atraumatic.  Eyes: Conjunctivae are normal.  Neck: Neck supple.  Cardiovascular: Normal rate, regular rhythm and normal heart sounds.   Pulmonary/Chest: Effort normal. No respiratory distress. He has no wheezes. He has no rales.  Abdominal: Soft. Bowel sounds are normal. He exhibits no distension. There is tenderness. There is no rebound.  LLQ tenderness. No guarding  Musculoskeletal: He exhibits no edema.  Neurological: He is alert and oriented to person, place, and time.  Skin: Skin is warm and dry.    ED Course  Procedures (including critical care time) Labs Review Labs Reviewed  CBC WITH DIFFERENTIAL - Abnormal; Notable for the following:    RBC 3.84 (*)    Hemoglobin 12.9 (*)    MCV 105.5 (*)    Neutrophils Relative % 82 (*)    Lymphocytes Relative 9 (*)    Lymphs Abs 0.4 (*)    All other components within normal limits  COMPREHENSIVE METABOLIC PANEL - Abnormal; Notable for the following:    Glucose, Bld 131 (*)    Creatinine, Ser 1.39 (*)    Albumin 3.4 (*)    AST 86 (*)    GFR calc non Af Amer 42 (*)    GFR calc Af Amer 49 (*)    All other components within normal limits  LIPASE, BLOOD - Abnormal; Notable for the following:    Lipase 9 (*)    All other components within normal limits  URINALYSIS, ROUTINE W REFLEX MICROSCOPIC - Abnormal; Notable for the following:    Color, Urine AMBER (*)    Hgb urine dipstick MODERATE (*)    Protein, ur >300 (*)    All other components within normal limits  URINE MICROSCOPIC-ADD ON - Abnormal; Notable for the following:    Casts HYALINE CASTS (*)    All other components within normal limits  OCCULT BLOOD, POC DEVICE - Abnormal; Notable for the following:    Fecal Occult Bld POSITIVE (*)    All other components within normal limits  URINE CULTURE  OCCULT BLOOD X 1 CARD TO LAB, STOOL  CG4 I-STAT (LACTIC ACID)   Imaging Review Dg Abd Acute  W/chest  09/26/2013   CLINICAL DATA:  Abdominal pain.  Nausea.  Shortness of breath.  EXAM: ACUTE ABDOMEN SERIES (ABDOMEN 2 VIEW & CHEST 1 VIEW)  COMPARISON:  DG CHEST 2 VIEW dated 06/24/2013; DG CHEST 2 VIEW dated 05/01/2013; DG CHEST 2 VIEW dated 02/28/2013; DG CHEST 2 VIEW dated 02/20/2013; CT ABD/PELVIS W CM dated 02/05/2010  FINDINGS: Gaseous distention of multiple loops of small bowel throughout the abdomen, demonstrating air-fluid levels on the erect image. No evidence of free intraperitoneal air. Gas and liquid stool throughout normal caliber colon. Left renal stent. Numerous pelvic phleboliths. Calcification projected to the left of the L2-3 level is too lateral to represent a urinary tract calculus. No definite opaque urinary tract calculi. Left renal artery stent. Degenerative changes involving the thoracic and lumbar spine.  Cardiac silhouette mildly to moderately enlarged but stable. Diffuse interstitial pulmonary fibrosis and low lung volumes, unchanged. No new pulmonary parenchymal abnormalities.  IMPRESSION: 1. Partial small bowel obstruction.  No free intraperitoneal air. 2. Stable cardiomegaly. Stable interstitial pulmonary fibrosis  with associated low lung volumes. No acute cardiopulmonary disease.   Electronically Signed   By: Evangeline Dakin M.D.   On: 09/10/2013 16:21    EKG Interpretation   None       MDM   Final diagnoses:  Partial small bowel obstruction    Patient with nausea vomiting, diarrhea are really are no bowel movement since 10 AM. His abdomen is soft, no acute abdomen on exam. He is tender in the upper abdomen as well as left lower quadrant. His fecal occult is positive however question possible trauma from disimpaction, will need further workup. His hemoglobin is mildly decreased from prior, it is 12.9 today. His stool is brown on exam. His labs are otherwise unremarkable. Acute abdominal series obtained to rule out obstruction or showed partial small bowel  obstruction. Patient will need further admission. This time no nausea vomiting, did not order NG tube.   5:44 PM Discussed with [facility] hospitalist, will admit, also has to order a CT abdomen pelvis. VS all within normal.   7:14 PM ECG obtained given tachycardia, pt with history of CAD. ECG showed new q waves and ST segment changes anteriorly with no reciprical changes. Dr. Wilson Singer spoke with Cardiology, will come see in ED. No anticoagulation at this time. Surgery consult pending.   7:29 PM Discussed with Dr. Hassell Done, general surgery, who reviewed his CT scan. No surgery at this time, will follow up closely, possible resolution on its own. No signs of complete obstruction at this time. Discussed whether OK to anticoagulate, and Dr. Hassell Done agreed its OK to anti coagulate to treat MI. Dr. Wilson Singer to call back to cardiology.   Pt was seen by Dr. Debby Bud, cardiology, will admit to [facility].     Renold Genta, PA-C 10/02/2013 2350

## 2013-09-25 NOTE — ED Notes (Signed)
Initial Contact - pt resting on stretcher, pt denies needs/complaints at this time.  Skin PWD.  MAEI.  Abd s/nt/nd, obese.  Speaking full/clear sentences.  Surgery MD at bs for consult.

## 2013-09-25 NOTE — ED Notes (Signed)
[facility] here to provide transport to [facility], verbal report to [place], paramedic.

## 2013-09-25 NOTE — ED Notes (Signed)
Bed: WA06 Expected date:  Expected time:  Means of arrival:  Comments: ems 

## 2013-09-25 NOTE — ED Notes (Addendum)
Unsuccessful attempt at placement of NG tube. Notified another RN who will attempt placement.

## 2013-09-25 NOTE — Progress Notes (Addendum)
ANTICOAGULATION CONSULT NOTE - Initial Consult  Pharmacy Consult for:  IV Heparin Indication:  ACS / STEMI  Allergies  Allergen Reactions  . Penicillins Hives and Rash    Patient Measurements: Height: 5\' 7"  (170.2 cm) Weight: 175 lb (79.379 kg) IBW/kg (Calculated) : 66.1  Vital Signs: Temp: 98.6 F (37 C) (02/16 1529) Temp src: Oral (02/16 1529) BP: 105/65 mmHg (02/16 1950) Pulse Rate: 92 (02/16 1950)  Labs:  Recent Labs  09/15/2013 1527 10/02/2013 1850  HGB 12.9*  --   HCT 40.5  --   PLT 170  --   CREATININE 1.39*  --   TROPONINI  --  15.94*    Estimated Creatinine Clearance: 34.2 ml/min (by C-G formula based on Cr of 1.39).   Medical History: Past Medical History  Diagnosis Date  . Vertigo   . 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 Hypertension   . GERD (gastroesophageal reflux disease)   . Gout   . RAS (renal artery stenosis)     PCI left renal artery 2005  . Coronary artery disease     a. s/p stent to RCA in 2000;  b. Last CLite 4/07: no ichemia, EF 73%  . CKD (chronic kidney disease) stage 3, GFR 30-59 ml/min   . Anginal pain   . COPD (chronic obstructive pulmonary disease)     2L O2 prn    Medications:  Pending  Assessment:  Asked to assist with Heparin therapy for this 80 year-old male with recent acute anterior wall MI.  CT abdomen found acute partial small bowel obstruction, and abdominal surgery may be necessary.  Goals of Therapy:  Heparin level 0.3-0.7 units/ml Monitor platelets by anticoagulation protocol: Yes   Plan:   Heparin 2000 units IV bolus, then an infusion at 1150 units/hr.  Heparin level 8 hours after starting the infusion and daily.  CBC daily while receiving Heparin.   [place]on[place]. 09/10/2013 9:49 PM

## 2013-09-25 NOTE — ED Provider Notes (Signed)
Medical screening examination/treatment/procedure(s) were conducted as a shared visit with non-physician practitioner(s) and myself.  I personally evaluated the patient during the encounter.  EKG Interpretation    Date/Time:  10-05-2013 18:38:26 EST Ventricular Rate:  110 PR Interval:  167 QRS Duration: 94 QT Interval:  358 QTC Calculation: 484 R Axis:   -46 Text Interpretation:  Sinus tachycardia Atrial premature complex Left axis deviation Probable anteroseptal infarct.  New anterior q waves and STE V2-V4 Confirmed by Kaide Gage  MD, Kishaun Erekson ([id number]) on 2013/10/05 6:55:13 PM           80 year old male with abdominal pain, nausea, vomiting and diarrhea. Imaging significant for intussusception of terminal ileum into the cecum. Initially medicine was paged for admission after results of x-ray. Need to discuss with surgery at this point. Additionally, patient has concerning new findings on his EKG. On further discussion he does endorse CP and pain "under my L shoulder blade." He reports this has been going on for the past 2-3 days but thought it was "indigestion." He endorses pain currently as well as mild SOB. Will send troponin. Cards consult. ASA/heparin deferred until discuss with surgery.  Troponin significantly elevated. Discussion with Dr Hassell Done, surgery, by Lahoma Rocker. Will observe at this point until MI addressed and tentatively plan for colonoscopy because of concern for mass. Fine with anticoagulation. ASA/heparin ordered. Will discussed again with cards. Likely transfer to [facility].    CRITICAL CARE Performed by: Virgel Manifold  Total critical care time: 35 minutes  Critical care time was exclusive of separately billable procedures and treating other patients. Critical care was necessary to treat or prevent imminent or life-threatening deterioration. Critical care was time spent personally by me on the following activities: development of treatment plan with patient and/or  surrogate as well as nursing, discussions with consultants, evaluation of patient's response to treatment, examination of patient, obtaining history from patient or surrogate, ordering and performing treatments and interventions, ordering and review of laboratory studies, ordering and review of radiographic studies, pulse oximetry and re-evaluation of patient's condition. ca  Virgel Manifold, MD 10/05/13 2033

## 2013-09-25 NOTE — Progress Notes (Signed)
ANTIBIOTIC CONSULT NOTE - INITIAL  Pharmacy Consult for Cipro Indication: intra-abdominal infection  Allergies  Allergen Reactions  . Penicillins Hives and Rash    Patient Measurements:    Vital Signs: Temp: 98.6 F (37 C) (02/16 1529) Temp src: Oral (02/16 1529) BP: 130/77 mmHg (02/16 1858) Pulse Rate: 105 (02/16 1859) Intake/Output from previous day:   Intake/Output from this shift:    Labs:  Recent Labs  09/11/2013 1527  WBC 4.9  HGB 12.9*  PLT 170  CREATININE 1.39*   The CrCl is unknown because both a height and weight (above a minimum accepted value) are required for this calculation. No results found for this basename: VANCOTROUGH, VANCOPEAK, VANCORANDOM, GENTTROUGH, GENTPEAK, GENTRANDOM, TOBRATROUGH, TOBRAPEAK, TOBRARND, AMIKACINPEAK, AMIKACINTROU, AMIKACIN,  in the last 72 hours   Microbiology: No results found for this or any previous visit (from the past 720 hour(s)).  Medical History: Past Medical History  Diagnosis Date  . Vertigo   . 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 Hypertension   . GERD (gastroesophageal reflux disease)   . Gout   . RAS (renal artery stenosis)     PCI left renal artery 2005  . Coronary artery disease     a. s/p stent to RCA in 2000;  b. Last CLite 4/07: no ichemia, EF 73%  . CKD (chronic kidney disease) stage 3, GFR 30-59 ml/min   . Anginal pain   . COPD (chronic obstructive pulmonary disease)     2L O2 prn    Assessment: 58 yoF presents with nausea, vomiting, abdominal pain, and diarrhea found to have partial SBO secondary to intussusception, possible ischemic colitis, possible perforation.  Differential diagnosis includes partial bowel obstruction, diverticulitis, gastroenteritis.  Starting empiric Cipro/Flagyl.  Flagyl 500 mg IV q8h ordered by MD and pharmacy consulted for Cipro dosing.  Afebrile  WBC 4.9  SCr 1.39, CrCl~40 ml/min  Urine culture ordered  Goal of Therapy:  Eradication of  infection  Plan:  1.  Cipro 400 mg IV q12h. 2.  F/u SCr, clinical course.  Hershal Coria 09/22/2013,7:33 PM

## 2013-09-25 NOTE — ED Notes (Signed)
Critical results given to Dr Wilson Singer and Lahoma Rocker PA

## 2013-09-25 NOTE — ED Notes (Signed)
Per EMS: Pt is from [facility]. Pt was seen her yesterday for abdominal pain and constipation. Pt reports centralized abdominal pain with nausea, emesis, and diarrhea. Pt reports vomiting four times yesterday, however none today. Pt reports last BM at 1000 this morning, which was loose.  Pt is A/O x4 and in NAD.

## 2013-09-25 NOTE — Consult Note (Signed)
Reason for Consult: Acute MI  Requesting Physician: Short  Cardiologist: Hochrein  HPI: Recent acute anterior wall myocardial infarction in an elderly man with acute partial small bowel obstruction  This is a 80 y.o. male with a past medical history significant for CAD, status post bare-metal stent to the right coronary artery in 2000, hypertension, COPD requiring oxygen supplementation as needed, stage III chronic kidney disease and renal artery stenosis status post angioplasty 2005, previous left lung surgery for a benign mass (Dr. Arlyce Dice, 2002), hypertension and gout.  For last several days she has had severe constipation and he was seen in the [facility] emergency room yesterday for presumed fecal impaction. He did not describe chest discomfort at that time and an electrocardiogram was not performed.   He returned to the emergency room today with worsening abdominal complaints and developed both nausea and vomiting and diarrhea. He has evidence of small bowel obstruction and there is suspicion for an intra-abdominal mass on CT, causing cecal intussusception. Also described chest pain and his electrocardiogram shows evidence of a recent anterior wall myocardial infarction with Q waves already well-developed across the anteroseptal leads. There is only mild residual ST segment elevation in leads V2 through V4. The cardiac troponin I was already elevated at approximately 16.  He describes chest discomfort that has some anginal features but also some pleuritic features. Right now he is experiencing some discomfort when taking a deep breath, possibly an expression of postinfarction pericarditis. The chest pressure that he experienced earlier has resolved. He denies problems of dyspnea other than the discomfort that he feels when he takes a deep breath.  He was known to have preserved left ventricular systolic function and had a normal nuclear stress test back in 2013. No regional wall  motion abnormalities were seen by his echocardiogram in July of 2014.    PMHx:  Past Medical History  Diagnosis Date  . Vertigo   . 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 Hypertension   . GERD (gastroesophageal reflux disease)   . Gout   . RAS (renal artery stenosis)     PCI left renal artery 2005  . Coronary artery disease     a. s/p stent to RCA in 2000;  b. Last CLite 4/07: no ichemia, EF 73%  . CKD (chronic kidney disease) stage 3, GFR 30-59 ml/min   . Anginal pain   . COPD (chronic obstructive pulmonary disease)     2L O2 prn   Past Surgical History  Procedure Laterality Date  . Stent to the righ coronary in 2000    . Lung surgery      benign spot from lung by Dr. Arlyce Dice  . Coronary angioplasty with stent placement    . Appendectomy    . Tonsillectomy    . Hemorroidectomy    . Cataract extraction Left   . Mouth surgery      area removed from mouth - lymphoma per pt - Dr Lucia Gaskins was dr.    Deirdre Pippins: Family History  Problem Relation Age of Onset  . Lung cancer Brother     heavy smoker  . Heart disease Father   . Congestive Heart Failure Father   . Rheum arthritis Mother   . Brain cancer Brother     SOCHx:  reports that he quit smoking about 32 years ago. His smoking use included Cigarettes. He has a 20 pack-year smoking history. He has never  used smokeless tobacco. He reports that he drinks alcohol. He reports that he does not use illicit drugs.  ALLERGIES: Allergies  Allergen Reactions  . Penicillins Hives and Rash    ROS: Pertinent items are noted in HPI. Review of Systems:  General: Denies fevers, chills, weight loss or gain  HEENT: Denies changes to hearing and vision, rhinorrhea, sinus congestion, sore throat. Has dry throat now  CV: Denies chest pain and palpitations, lower extremity edema.  PULM: Chronic SOB, wheezing, cough.  GI: Per HPI  GU: Denies dysuria, frequency, urgency  ENDO: Denies polyuria, polydipsia.  HEME: Denies  hematemesis, blood in stools, melena, abnormal bruising or bleeding.  LYMPH: Denies lymphadenopathy.  MSK: Denies arthralgias, myalgias.  DERM: Denies skin rash or ulcer.  NEURO: Denies focal numbness, weakness, slurred speech, confusion, facial droop.  PSYCH: Denies anxiety and depression.    HOME MEDICATIONS:  (Not in a hospital admission)  HOSPITAL MEDICATIONS: I have reviewed the patient's current medications. Prior to Admission:  (Not in a hospital admission)  VITALS: Blood pressure 105/65, pulse 92, temperature 98.6 F (37 C), temperature source Oral, resp. rate 22, SpO2 99.00%.  PHYSICAL EXAM:  General: Alert, oriented x3, no distress Head: no evidence of trauma, PERRL, EOMI, no exophtalmos or lid lag, no myxedema, no xanthelasma; normal ears, nose and oropharynx Neck: Normal jugular venous pulsations and no hepatojugular reflux; brisk carotid pulses without delay and no carotid bruits Chest: clear to auscultation but breath sounds are slightly diminished bilaterally, no signs of consolidation by percussion or palpation, normal fremitus, symmetrical and full respiratory excursions Cardiovascular: normal position and quality of the apical impulse, regular rhythm, normal first heart sound and normal second heart sound, no rub, positive S4 gallop, no murmur Abdomen: Marked distention, no abnormal pulsatility or arterial bruits, high pitched hyperactive bowel sounds, no hepatosplenomegaly Extremities: no clubbing, cyanosis;  no edema; 2+ radial, ulnar and brachial pulses bilaterally; 2+ right femoral, posterior tibial and dorsalis pedis pulses; 2+ left femoral, posterior tibial and dorsalis pedis pulses; no subclavian or femoral bruits Neurological: grossly nonfocal   LABS  CBC  Recent Labs  10/01/2013 1527  WBC 4.9  NEUTROABS 4.0  HGB 12.9*  HCT 40.5  MCV 105.5*  PLT 123XX123   Basic Metabolic Panel  Recent Labs  10/01/2013 1527  NA 142  K 4.0  CL 99  CO2 28    GLUCOSE 131*  BUN 19  CREATININE 1.39*  CALCIUM 9.5   Liver Function Tests  Recent Labs  09/13/2013 1527  AST 86*  ALT 16  ALKPHOS 101  BILITOT 0.5  PROT 7.1  ALBUMIN 3.4*    Recent Labs  09/12/2013 1527  LIPASE 9*   Cardiac Enzymes  Recent Labs  09/23/2013 1850  TROPONINI 15.94*   IMAGING: Ct Abdomen Pelvis W Contrast  09/10/2013   CLINICAL DATA:  Abdominal pain, vomiting, constipation  EXAM: CT ABDOMEN AND PELVIS WITH CONTRAST  TECHNIQUE: Multidetector CT imaging of the abdomen and pelvis was performed using the standard protocol following bolus administration of intravenous contrast.  CONTRAST:  25mL OMNIPAQUE IOHEXOL 300 MG/ML SOLN, 22mL OMNIPAQUE IOHEXOL 300 MG/ML SOLN  COMPARISON:  02/05/2010  FINDINGS: Osteopenia and degenerative changes thoracolumbar spine. Extensive atherosclerotic calcifications of abdominal aorta, bilateral renal artery and bilateral iliac arteries are noted.  Lung bases shows bilateral probable chronic interstitial prominence and mild fibrotic changes.  Liver, pancreas, spleen and adrenals are unremarkable. Small layering gallstones are noted within gallbladder. No aortic aneurysm. Kidneys are symmetrical in enhancement.  There is mild cortical thinning on the left side probable due to mild asymmetric atrophy. Bilateral small renal cysts are noted.  Delayed renal images shows bilateral renal symmetrical excretion. Bilateral visualized proximal ureter is unremarkable. There is no small bowel obstruction.  There is abnormal appearance of the cecum and ileocecal valve. There is polypoid prominence of the ileocecal valve and in axial image 51 There is target like appearance of the cecum. This is best seen in coronal image 44. Findings are highly suspicious for intussusception of the terminal ileum into the cecum. There is mild surrounding mesenteric edema and congested mesenteric vessels. In coronal image 39 there are tiny air bubbles in adjacent mesentery findings  may be due to ischemic changes less likely perforation or may represent intraluminal air trapped within intussusception. There is at least partial obstruction at this level.  The urinary bladder is unremarkable. Tiny prostate gland calcifications. No pelvic ascites or free air. No free abdominal air. No destructive bony lesions are noted within pelvis.  IMPRESSION: 1. There is abnormal appearance of the cecum and ileocecal valve. There is polypoid prominence of the ileocecal valve in axial image 51 There is target like appearance of the cecum. This is best seen in coronal image 44. Findings are highly suspicious for intussusception of the terminal ileum into the cecum. There is mild surrounding mesenteric edema and congested mesenteric vessels. In coronal image 39 there are tiny air bubbles in adjacent mesentery findings may be due to ischemic changes less likely perforation or may represent intraluminal air trapped within intussusception. There is at least partial obstruction at this level. A neoplastic process at ileocecal valve level cannot be entirely excluded. 2. No hydronephrosis or hydroureter. 3. No free abdominal air. 4. Mild a left renal atrophy.  These results were called by telephone at the time of interpretation on 09/22/2013 at 6:48 PM to Dr. Jeannett Senior , who verbally acknowledged these results.   Electronically Signed   By: Lahoma Crocker M.D.   On: 09/13/2013 18:48   Dg Abd Acute W/chest  10/07/2013   CLINICAL DATA:  Abdominal pain.  Nausea.  Shortness of breath.  EXAM: ACUTE ABDOMEN SERIES (ABDOMEN 2 VIEW & CHEST 1 VIEW)  COMPARISON:  DG CHEST 2 VIEW dated 06/24/2013; DG CHEST 2 VIEW dated 05/01/2013; DG CHEST 2 VIEW dated 02/28/2013; DG CHEST 2 VIEW dated 02/20/2013; CT ABD/PELVIS W CM dated 02/05/2010  FINDINGS: Gaseous distention of multiple loops of small bowel throughout the abdomen, demonstrating air-fluid levels on the erect image. No evidence of free intraperitoneal air. Gas and liquid  stool throughout normal caliber colon. Left renal stent. Numerous pelvic phleboliths. Calcification projected to the left of the L2-3 level is too lateral to represent a urinary tract calculus. No definite opaque urinary tract calculi. Left renal artery stent. Degenerative changes involving the thoracic and lumbar spine.  Cardiac silhouette mildly to moderately enlarged but stable. Diffuse interstitial pulmonary fibrosis and low lung volumes, unchanged. No new pulmonary parenchymal abnormalities.  IMPRESSION: 1. Partial small bowel obstruction.  No free intraperitoneal air. 2. Stable cardiomegaly. Stable interstitial pulmonary fibrosis with associated low lung volumes. No acute cardiopulmonary disease.   Electronically Signed   By: Evangeline Dakin M.D.   On: 09/21/2013 16:21    ECG: Sinus rhythm, rare PACs recent anterior wall myocardial infarction with mild residual ST segment elevation but well-developed Q waves across V1 through V4  TELEMETRY:  no ventricular tachycardia seen  IMPRESSION: 1. Recent acute anterior wall ST segment elevation  myocardial infarction, Killip class I. The exact timing of the event is uncertain but is at least 6 hours ago. At this point he does not appear to have anginal pain and ECG shows a completed infarction. He does have some pleuritic discomfort which may be pericarditis. Secondary to delayed presentation, emergency angiography and percutaneous revascularization is no longer indicated. In fact revascularization will have to be undertaken in a very careful and judicious fashion, since there is a high likelihood he will require abdominal surgery in the near future. We'll keep n.p.o. in anticipation of possible angiography tomorrow, but if the LAD artery is totally occluded, medical management may be the best course at this point. If there is flow down the LAD artery and there is a chance for myocardial viability he will benefit from percutaneous revascularization, but will  have to avoid drug-eluting stents since this will delay abdominal surgery for months. He has done quite well with a bare-metal stent in the past without restenosis. 2. If emergency abdominal surgery is necessary for perforated viscus, obviously this will have to be undertaken but he will be at high-risk for major perioperative complications 3. Intravenous heparin, aspirin, intravenous beta blockers (keep him n.p.o.) intravenous nitroglycerin only if he develops recurrent chest discomfort. Once he can take by mouth medications, ACE inhibitors will likely be indicated. No statin yet since he cannot take by mouth medications 4. Prognosis is extremely guarded in view of his advanced age an unfortunate simultaneous abdominal process and acute MI in the distribution of the LAD artery 5. He will be best served by admission to the CCU with a Gen. surgery consult. Appreciate the hospitalist service management to this point.    Time Spent Directly with Patient: 60 minutes  Sanda Klein, MD, [facility] [phone] office [phone] pager   09/24/2013, 8:36 PM

## 2013-09-26 ENCOUNTER — Encounter (HOSPITAL_COMMUNITY): Payer: Self-pay | Admitting: General Surgery

## 2013-09-26 ENCOUNTER — Encounter (HOSPITAL_COMMUNITY): Admission: EM | Disposition: E | Payer: Self-pay | Source: Home / Self Care | Attending: Cardiology

## 2013-09-26 ENCOUNTER — Inpatient Hospital Stay (HOSPITAL_COMMUNITY): Payer: Medicare Other

## 2013-09-26 DIAGNOSIS — I059 Rheumatic mitral valve disease, unspecified: Secondary | ICD-10-CM

## 2013-09-26 DIAGNOSIS — R109 Unspecified abdominal pain: Secondary | ICD-10-CM

## 2013-09-26 DIAGNOSIS — R112 Nausea with vomiting, unspecified: Secondary | ICD-10-CM

## 2013-09-26 LAB — HEMOGLOBIN A1C
Hgb A1c MFr Bld: 5.1 % (ref <5.7)
Mean Plasma Glucose: 100 mg/dL (ref <117)

## 2013-09-26 LAB — GI PATHOGEN PANEL BY PCR, STOOL
C difficile toxin A/B: NEGATIVE
Campylobacter by PCR: NEGATIVE
Cryptosporidium by PCR: NEGATIVE
E COLI (ETEC) LT/ST: NEGATIVE
E coli (STEC): NEGATIVE
E coli 0157 by PCR: NEGATIVE
G lamblia by PCR: NEGATIVE
NOROVIRUS G1/G2: POSITIVE
ROTAVIRUS A BY PCR: NEGATIVE
SALMONELLA BY PCR: NEGATIVE
Shigella by PCR: NEGATIVE

## 2013-09-26 LAB — LIPID PANEL
CHOLESTEROL: 198 mg/dL (ref 0–200)
HDL: 89 mg/dL (ref >39)
LDL CALC: 90 mg/dL (ref 0–99)
TRIGLYCERIDES: 94 mg/dL (ref <150)
Total CHOL/HDL Ratio: 2.2 RATIO
VLDL: 19 mg/dL (ref 0–40)

## 2013-09-26 LAB — PRO B NATRIURETIC PEPTIDE: PRO B NATRI PEPTIDE: 30802 pg/mL — AB (ref 0–450)

## 2013-09-26 LAB — URINE CULTURE
COLONY COUNT: NO GROWTH
CULTURE: NO GROWTH

## 2013-09-26 LAB — BASIC METABOLIC PANEL
BUN: 17 mg/dL (ref 6–23)
CALCIUM: 9.2 mg/dL (ref 8.4–10.5)
CO2: 25 mEq/L (ref 19–32)
Chloride: 98 mEq/L (ref 96–112)
Creatinine, Ser: 1.33 mg/dL (ref 0.50–1.35)
GFR, EST AFRICAN AMERICAN: 52 mL/min — AB (ref >90)
GFR, EST NON AFRICAN AMERICAN: 45 mL/min — AB (ref >90)
Glucose, Bld: 130 mg/dL — ABNORMAL HIGH (ref 70–99)
POTASSIUM: 3.9 meq/L (ref 3.7–5.3)
Sodium: 140 mEq/L (ref 137–147)

## 2013-09-26 LAB — CBC WITH DIFFERENTIAL/PLATELET
Basophils Absolute: 0 10*3/uL (ref 0.0–0.1)
Basophils Relative: 0 % (ref 0–1)
Eosinophils Absolute: 0 10*3/uL (ref 0.0–0.7)
Eosinophils Relative: 1 % (ref 0–5)
HCT: 42.3 % (ref 39.0–52.0)
HEMOGLOBIN: 13.5 g/dL (ref 13.0–17.0)
LYMPHS ABS: 0.8 10*3/uL (ref 0.7–4.0)
LYMPHS PCT: 10 % — AB (ref 12–46)
MCH: 34.3 pg — AB (ref 26.0–34.0)
MCHC: 31.9 g/dL (ref 30.0–36.0)
MCV: 107.4 fL — AB (ref 78.0–100.0)
MONOS PCT: 12 % (ref 3–12)
Monocytes Absolute: 0.9 10*3/uL (ref 0.1–1.0)
NEUTROS PCT: 78 % — AB (ref 43–77)
Neutro Abs: 6.3 10*3/uL (ref 1.7–7.7)
PLATELETS: 173 10*3/uL (ref 150–400)
RBC: 3.94 MIL/uL — ABNORMAL LOW (ref 4.22–5.81)
RDW: 15.3 % (ref 11.5–15.5)
WBC: 8.1 10*3/uL (ref 4.0–10.5)

## 2013-09-26 LAB — MRSA PCR SCREENING: MRSA BY PCR: NEGATIVE

## 2013-09-26 LAB — HEPARIN LEVEL (UNFRACTIONATED)
HEPARIN UNFRACTIONATED: 0.27 [IU]/mL — AB (ref 0.30–0.70)
Heparin Unfractionated: 0.12 IU/mL — ABNORMAL LOW (ref 0.30–0.70)
Heparin Unfractionated: 0.24 IU/mL — ABNORMAL LOW (ref 0.30–0.70)

## 2013-09-26 LAB — TROPONIN I
Troponin I: >20 ng/mL (ref <0.30)
Troponin I: 18.77 ng/mL (ref <0.30)

## 2013-09-26 LAB — CLOSTRIDIUM DIFFICILE BY PCR: Toxigenic C. Difficile by PCR: NEGATIVE

## 2013-09-26 SURGERY — LEFT HEART CATHETERIZATION WITH CORONARY ANGIOGRAM
Anesthesia: LOCAL

## 2013-09-26 MED ORDER — HEPARIN BOLUS VIA INFUSION
2000.0000 [IU] | Freq: Once | INTRAVENOUS | Status: DC
  Filled 2013-09-26: qty 2000

## 2013-09-26 MED ORDER — FUROSEMIDE 10 MG/ML IJ SOLN
INTRAMUSCULAR | Status: AC
  Filled 2013-09-26: qty 4

## 2013-09-26 MED ORDER — SODIUM CHLORIDE 0.9 % IJ SOLN: 3.0000 mL | INTRAMUSCULAR | Status: DC | PRN

## 2013-09-26 MED ORDER — SODIUM CHLORIDE 0.9 % IV SOLN: 250.0000 mL | INTRAVENOUS | Status: DC | PRN

## 2013-09-26 MED ORDER — FUROSEMIDE 10 MG/ML IJ SOLN
40.0000 mg | Freq: Once | INTRAMUSCULAR | Status: AC
  Administered 2013-09-26: 40 mg via INTRAVENOUS

## 2013-09-26 MED ORDER — METOPROLOL TARTRATE 1 MG/ML IV SOLN
7.5000 mg | Freq: Four times a day (QID) | INTRAVENOUS | Status: DC | PRN
  Administered 2013-09-26: 7.5 mg via INTRAVENOUS
  Filled 2013-09-26: qty 10

## 2013-09-26 MED ORDER — SODIUM CHLORIDE 0.9 % IJ SOLN
3.0000 mL | Freq: Two times a day (BID) | INTRAMUSCULAR | Status: DC
  Administered 2013-09-26 – 2013-09-27 (×2): 3 mL via INTRAVENOUS

## 2013-09-26 NOTE — Progress Notes (Signed)
ANTICOAGULATION CONSULT NOTE - Follow Up Consult  Pharmacy Consult for heparin Indication: MI  Labs:  Recent Labs  09/17/2013 1527 10/05/2013 1850 09/13/2013 2050  2315 09/15/2013 0542  HGB 12.9*  --   --   --   --   HCT 40.5  --   --   --   --   PLT 170  --   --   --   --   APTT  --   --  34  --   --   LABPROT  --   --  12.1  --   --   INR  --   --  0.91  --   --   HEPARINUNFRC  --   --   --   --  0.12*  CREATININE 1.39*  --   --   --   --   TROPONINI  --  15.94*  --  >20.00*  --     Assessment: 80yo male subtherapeutic on heparin with initial dosing for ACS.  Goal of Therapy:  Heparin level 0.3-0.7 units/ml   Plan:  Will rebolus with heparin 2000 units and increase gtt by 3 units/kg/hr to 1400 units/hr and check level in [place], PharmD, BCPS  10/06/2013,6:35 AM

## 2013-09-26 NOTE — Progress Notes (Signed)
CRITICAL VALUE ALERT  Critical value received:  Troponin >20  Date of notification:  10/01/2013  Time of notification: 0002  Critical value read back: yes  Nurse who received alert:  Montez Morita reported to Primary Nurse Dustin Folks RN  MD notified (1st page):  MD Colon Flattery  Time of first page: 0100  MD notified (2nd page):  Time of second page:  Responding MD:  MD Colon Flattery  Time MD responded:  [phone]

## 2013-09-26 NOTE — ED Provider Notes (Signed)
Medical screening examination/treatment/procedure(s) were conducted as a shared visit with non-physician practitioner(s) and myself.  I personally evaluated the patient during the encounter.   See other note.   Virgel Manifold, MD 09/25/2013 1331

## 2013-09-26 NOTE — Progress Notes (Signed)
I agree with the Student-Dietitian note and made appropriate revisions.  Katie Temara Lanum, RD, LDN Pager #: [phone] After-Hours Pager #: [phone]  

## 2013-09-26 NOTE — Consult Note (Signed)
I saw the patient, participated in the history, exam and medical decision making, and concur with the physician assistant's note above.  Alert, some mild tachypnea Soft, nt, nd  Ct reviewed. SB/cecal intussusception causing psbo  sbo appears to be resolving. No need for urgent intervention.  Given ct findings, a mass/polyp serving as the lead point for the intussusception is of concern.  Since abd is benign currently, rec proceeding with coronary intervention for AMI rec GI consult for colonoscopy Clear liquids + nutritional shakes until consult by GI medicine to discuss timing of colonoscopy  Leighton Ruff. Redmond Pulling, MD, FACS General, Bariatric, & Minimally Invasive Surgery [facility], [place]

## 2013-09-26 NOTE — Interval H&P Note (Signed)
History and Physical Interval Note:  09/14/2013 1:26 PM I have spoken with Dr. Sallyanne Kuster, reviewed the patient's chart, interviewed and examined the patient. He is awake and able to give a history. He denies chest pain and dyspnea. He has been somewhat confused according to the nurse and attempted to get out of bed to urinate. He is on a non-rebreathing mask because of desaturation into the mid 80s when he moves about in bed. On exam there are rales at the bases bilaterally. His neck veins are elevated. His abdomen is mildly distended but nontender. Lower extremities are cold. An echo ordered earlier today is still pending. Cardiac troponins were greater than 20. EKG reveals precordial Q waves.  The patient appears to be in acute systolic heart failure. At his age and under the circumstances I would defer performing diagnostic catheterization until he is more stable.  We'll give IV Lasix. This could cause hypotension so will need to watch him closely. We will decrease IV fluids. We will check the echo results. We should transfer him to the coronary care unit.  Under the circumstances this patient has a poor prognosis given his age, comorbidities, and recent presumed completed anterior infarct. It more conservative approach may be an option to discuss with the patient and family.  I am in the cath lab tomorrow, and if he is clinically improved with reference to heart failure, we will consider proceeding with the diagnostic catheterization

## 2013-09-26 NOTE — Progress Notes (Signed)
  Echocardiogram 2D Echocardiogram has been performed.  [name] FRANCES , 11:46 AM

## 2013-09-26 NOTE — H&P (View-Only) (Signed)
Patient Name: [name] Date of Encounter: 09/10/2013  Principal Problem:   Partial small bowel obstruction Active Problems:   Hypertension   CKD (chronic kidney disease) stage 3, GFR 30-59 ml/min   Nausea and vomiting   CAD (coronary artery disease)   Elevated troponin   Acute MI, anterior wall, initial episode of care   Length of Stay: 1  SUBJECTIVE  Denies abdominal or chest pain, no dyspnea Mild sinus tachycardia, no ventricular arrhythmia. Occasionally a little disoriented, easily redirected. Abdominal distention is substantially better. Passing gas and a small BM.  CURRENT MEDS . aspirin  324 mg Oral NOW   Or  . aspirin  300 mg Rectal NOW  . aspirin EC  81 mg Oral Daily  . ciprofloxacin  400 mg Intravenous Q12H  . famotidine (PEPCID) IV  20 mg Intravenous QHS  . fluticasone  2 spray Each Nare Daily  . gatifloxacin  1 drop Left Eye 4 times per day  . heparin  2,000 Units Intravenous Once  . metoprolol  5 mg Intravenous 4 times per day  . metronidazole  500 mg Intravenous Q8H  . pantoprazole (PROTONIX) IV  40 mg Intravenous BID  . prednisoLONE acetate  1 drop Left Eye 4 times per day  . sodium chloride  3 mL Intravenous Q12H    OBJECTIVE   Intake/Output Summary (Last 24 hours) at 09/14/2013 1051 Last data filed at 09/23/2013 0700  Gross per 24 hour  Intake    720 ml  Output    450 ml  Net    270 ml   Filed Weights   10/06/2013 2040 09/10/2013 0600  Weight: 79.379 kg (175 lb) 86.2 kg (190 lb 0.6 oz)    PHYSICAL EXAM Filed Vitals:   09/19/2013 0500 09/14/2013 0600 09/16/2013 0700 09/30/2013 0857  BP: 122/83 114/90    Pulse: 105 94 99   Temp:    97.4 F (36.3 C)  TempSrc:    Oral  Resp: 26 27 25    Height:      Weight:  86.2 kg (190 lb 0.6 oz)    SpO2: 91% 94% 98%    General: Alert, oriented x3, no distress  Head: no evidence of trauma, PERRL, EOMI, no exophtalmos or lid lag, no myxedema, no xanthelasma; normal ears, nose and oropharynx  Neck: Normal jugular  venous pulsations and no hepatojugular reflux; brisk carotid pulses without delay and no carotid bruits  Chest: clear to auscultation but breath sounds are slightly diminished bilaterally, no signs of consolidation by percussion or palpation, normal fremitus, symmetrical and full respiratory excursions  Cardiovascular: normal position and quality of the apical impulse, regular rhythm, normal first heart sound and normal second heart sound, no rub, positive S4 gallop, no murmur  Abdomen: Moderate distention, soft, nontender, no abnormal pulsatility or arterial bruits, hyperactive bowel sounds, no hepatosplenomegaly  Extremities: no clubbing, cyanosis; no edema; 2+ radial, ulnar and brachial pulses bilaterally; 2+ right femoral, posterior tibial and dorsalis pedis pulses; 2+ left femoral, posterior tibial and dorsalis pedis pulses; no subclavian or femoral bruits  Neurological: grossly nonfocal   LABS  CBC  Recent Labs  09/22/2013 1527 09/23/2013 0542  WBC 4.9 8.1  NEUTROABS 4.0 6.3  HGB 12.9* 13.5  HCT 40.5 42.3  MCV 105.5* 107.4*  PLT 170 720   Basic Metabolic Panel  Recent Labs  09/18/2013 1527 09/22/2013 0542  NA 142 140  K 4.0 3.9  CL 99 98  CO2 28 25  GLUCOSE 131* 130*  BUN 19 17  CREATININE 1.39* 1.33  CALCIUM 9.5 9.2   Liver Function Tests  Recent Labs  10/20/2013 1527  AST 86*  ALT 16  ALKPHOS 101  BILITOT 0.5  PROT 7.1  ALBUMIN 3.4*    Recent Labs  October 20, 2013 1527  LIPASE 9*   Cardiac Enzymes  Recent Labs  10-20-13 1850 20-Oct-2013 2315 09/20/2013 0542  TROPONINI 15.94* >20.00* >20.00*   Fasting Lipid Panel  Recent Labs  09/23/2013 0542  CHOL 198  HDL 89  LDLCALC 90  TRIG 94  CHOLHDL 2.2   Radiology Studies Imaging results have been reviewed and Ct Abdomen Pelvis W Contrast  10/20/2013   CLINICAL DATA:  Abdominal pain, vomiting, constipation  EXAM: CT ABDOMEN AND PELVIS WITH CONTRAST  TECHNIQUE: Multidetector CT imaging of the abdomen and pelvis was  performed using the standard protocol following bolus administration of intravenous contrast.  CONTRAST:  60mL OMNIPAQUE IOHEXOL 300 MG/ML SOLN, 46mL OMNIPAQUE IOHEXOL 300 MG/ML SOLN  COMPARISON:  02/05/2010  FINDINGS: Osteopenia and degenerative changes thoracolumbar spine. Extensive atherosclerotic calcifications of abdominal aorta, bilateral renal artery and bilateral iliac arteries are noted.  Lung bases shows bilateral probable chronic interstitial prominence and mild fibrotic changes.  Liver, pancreas, spleen and adrenals are unremarkable. Small layering gallstones are noted within gallbladder. No aortic aneurysm. Kidneys are symmetrical in enhancement. There is mild cortical thinning on the left side probable due to mild asymmetric atrophy. Bilateral small renal cysts are noted.  Delayed renal images shows bilateral renal symmetrical excretion. Bilateral visualized proximal ureter is unremarkable. There is no small bowel obstruction.  There is abnormal appearance of the cecum and ileocecal valve. There is polypoid prominence of the ileocecal valve and in axial image 51 There is target like appearance of the cecum. This is best seen in coronal image 44. Findings are highly suspicious for intussusception of the terminal ileum into the cecum. There is mild surrounding mesenteric edema and congested mesenteric vessels. In coronal image 39 there are tiny air bubbles in adjacent mesentery findings may be due to ischemic changes less likely perforation or may represent intraluminal air trapped within intussusception. There is at least partial obstruction at this level.  The urinary bladder is unremarkable. Tiny prostate gland calcifications. No pelvic ascites or free air. No free abdominal air. No destructive bony lesions are noted within pelvis.  IMPRESSION: 1. There is abnormal appearance of the cecum and ileocecal valve. There is polypoid prominence of the ileocecal valve in axial image 51 There is target like  appearance of the cecum. This is best seen in coronal image 44. Findings are highly suspicious for intussusception of the terminal ileum into the cecum. There is mild surrounding mesenteric edema and congested mesenteric vessels. In coronal image 39 there are tiny air bubbles in adjacent mesentery findings may be due to ischemic changes less likely perforation or may represent intraluminal air trapped within intussusception. There is at least partial obstruction at this level. A neoplastic process at ileocecal valve level cannot be entirely excluded. 2. No hydronephrosis or hydroureter. 3. No free abdominal air. 4. Mild a left renal atrophy.  These results were called by telephone at the time of interpretation on 20-Oct-2013 at 6:48 PM to Dr. Jeannett Senior , who verbally acknowledged these results.   Electronically Signed   By: Lahoma Crocker M.D.   On: 2013-10-20 18:48   Dg Abd Acute W/chest  20-Oct-2013   CLINICAL DATA:  Abdominal pain.  Nausea.  Shortness of breath.  EXAM:  ACUTE ABDOMEN SERIES (ABDOMEN 2 VIEW & CHEST 1 VIEW)  COMPARISON:  DG CHEST 2 VIEW dated 06/24/2013; DG CHEST 2 VIEW dated 05/01/2013; DG CHEST 2 VIEW dated 02/28/2013; DG CHEST 2 VIEW dated 02/20/2013; CT ABD/PELVIS W CM dated 02/05/2010  FINDINGS: Gaseous distention of multiple loops of small bowel throughout the abdomen, demonstrating air-fluid levels on the erect image. No evidence of free intraperitoneal air. Gas and liquid stool throughout normal caliber colon. Left renal stent. Numerous pelvic phleboliths. Calcification projected to the left of the L2-3 level is too lateral to represent a urinary tract calculus. No definite opaque urinary tract calculi. Left renal artery stent. Degenerative changes involving the thoracic and lumbar spine.  Cardiac silhouette mildly to moderately enlarged but stable. Diffuse interstitial pulmonary fibrosis and low lung volumes, unchanged. No new pulmonary parenchymal abnormalities.  IMPRESSION: 1. Partial  small bowel obstruction.  No free intraperitoneal air. 2. Stable cardiomegaly. Stable interstitial pulmonary fibrosis with associated low lung volumes. No acute cardiopulmonary disease.   Electronically Signed   By: Evangeline Dakin M.D.   On: 09/11/2013 16:21    TELE Sinus tachy  ECG Evolving anteroseptal MI  ASSESSMENT AND PLAN It appears unlikely he will require urgent abdominal surgery. Labs confirm recent, but completed anterior MI. Echo pending. So far no signs of CHF or ventricular arrhythmia. Discussed with General Surgery - they think he will need a colonoscopy first. Plan coronary angiography today to define anatomy/risk. If LAD occluded, treat medically. If there is reestablished flow and there is a hope for myocardial viability, would perform PCI with a bare metal stent to limit duration of dual antiplatelet therapy, allowing for earlier colonoscopy/biopsy and possible future surgery. Start ACE inhibitor after cath. Increase betablocker dose.   Sanda Klein, MD, [facility] [phone] office [phone] pager 10/01/2013 10:51 AM

## 2013-09-26 NOTE — Progress Notes (Signed)
ANTICOAGULATION CONSULT NOTE - Follow Up Consult  Pharmacy Consult for Heparin Indication: STEMI  Allergies  Allergen Reactions  . Penicillins Hives and Rash    Patient Measurements: Height: 5\' 7"  (170.2 cm) Weight: 190 lb 0.6 oz (86.2 kg) IBW/kg (Calculated) : 66.1 Heparin Dosing Weight: 86 kg  Vital Signs: Temp: 98.2 F (36.8 C) (02/17 1121) Temp src: Oral (02/17 1121) BP: 112/80 mmHg (02/17 1500) Pulse Rate: 94 (02/17 1500)  Labs:  Recent Labs  09/17/2013 1527  10/03/2013 1850 09/20/2013 2050  2315 10-08-2013 0542 10/08/13 1150 October 08, 2013 1420  HGB 12.9*  --   --   --   --  13.5  --   --   HCT 40.5  --   --   --   --  42.3  --   --   PLT 170  --   --   --   --  173  --   --   APTT  --   --   --  34  --   --   --   --   LABPROT  --   --   --  12.1  --   --   --   --   INR  --   --   --  0.91  --   --   --   --   HEPARINUNFRC  --   --   --   --   --  0.12*  --  0.24*  CREATININE 1.39*  --   --   --   --  1.33  --   --   TROPONINI  --   < > 15.94*  --  >20.00* >20.00* 18.77*  --   < > = values in this interval not displayed.  Estimated Creatinine Clearance: 37.1 ml/min (by C-G formula based on Cr of 1.33).  Assessment: 80 y.o. male on heparin for delayed presentation STEMI. No emergent cath due to delayed presentation. CBC stable. No bleeding noted. Heparin level 0.24 (remains subtherapeutic). Appears that bolus not given this a.m.  Goal of Therapy:  Heparin level 0.3-0.7 units/ml Monitor platelets by anticoagulation protocol: Yes   Plan:  1.  Increase heparin to 1550 units/hr 2. Will f/u 8 hr heparin level  Sherlon Handing, PharmD, BCPS Clinical pharmacist, pager [phone] 2013-10-08,3:15 PM

## 2013-09-26 NOTE — Progress Notes (Signed)
Patient Name: [name] Date of Encounter: 10/07/2013  Principal Problem:   Partial small bowel obstruction Active Problems:   Hypertension   CKD (chronic kidney disease) stage 3, GFR 30-59 ml/min   Nausea and vomiting   CAD (coronary artery disease)   Elevated troponin   Acute MI, anterior wall, initial episode of care   Length of Stay: 1  SUBJECTIVE  Denies abdominal or chest pain, no dyspnea Mild sinus tachycardia, no ventricular arrhythmia. Occasionally a little disoriented, easily redirected. Abdominal distention is substantially better. Passing gas and a small BM.  CURRENT MEDS . aspirin  324 mg Oral NOW   Or  . aspirin  300 mg Rectal NOW  . aspirin EC  81 mg Oral Daily  . ciprofloxacin  400 mg Intravenous Q12H  . famotidine (PEPCID) IV  20 mg Intravenous QHS  . fluticasone  2 spray Each Nare Daily  . gatifloxacin  1 drop Left Eye 4 times per day  . heparin  2,000 Units Intravenous Once  . metoprolol  5 mg Intravenous 4 times per day  . metronidazole  500 mg Intravenous Q8H  . pantoprazole (PROTONIX) IV  40 mg Intravenous BID  . prednisoLONE acetate  1 drop Left Eye 4 times per day  . sodium chloride  3 mL Intravenous Q12H    OBJECTIVE   Intake/Output Summary (Last 24 hours) at 09/14/2013 1051 Last data filed at 09/23/2013 0700  Gross per 24 hour  Intake    720 ml  Output    450 ml  Net    270 ml   Filed Weights   10/06/2013 2040 09/10/2013 0600  Weight: 79.379 kg (175 lb) 86.2 kg (190 lb 0.6 oz)    PHYSICAL EXAM Filed Vitals:   09/19/2013 0500 09/14/2013 0600 09/16/2013 0700 09/30/2013 0857  BP: 122/83 114/90    Pulse: 105 94 99   Temp:    97.4 F (36.3 C)  TempSrc:    Oral  Resp: 26 27 25    Height:      Weight:  86.2 kg (190 lb 0.6 oz)    SpO2: 91% 94% 98%    General: Alert, oriented x3, no distress  Head: no evidence of trauma, PERRL, EOMI, no exophtalmos or lid lag, no myxedema, no xanthelasma; normal ears, nose and oropharynx  Neck: Normal jugular  venous pulsations and no hepatojugular reflux; brisk carotid pulses without delay and no carotid bruits  Chest: clear to auscultation but breath sounds are slightly diminished bilaterally, no signs of consolidation by percussion or palpation, normal fremitus, symmetrical and full respiratory excursions  Cardiovascular: normal position and quality of the apical impulse, regular rhythm, normal first heart sound and normal second heart sound, no rub, positive S4 gallop, no murmur  Abdomen: Moderate distention, soft, nontender, no abnormal pulsatility or arterial bruits, hyperactive bowel sounds, no hepatosplenomegaly  Extremities: no clubbing, cyanosis; no edema; 2+ radial, ulnar and brachial pulses bilaterally; 2+ right femoral, posterior tibial and dorsalis pedis pulses; 2+ left femoral, posterior tibial and dorsalis pedis pulses; no subclavian or femoral bruits  Neurological: grossly nonfocal   LABS  CBC  Recent Labs  09/22/2013 1527 09/23/2013 0542  WBC 4.9 8.1  NEUTROABS 4.0 6.3  HGB 12.9* 13.5  HCT 40.5 42.3  MCV 105.5* 107.4*  PLT 170 720   Basic Metabolic Panel  Recent Labs  09/18/2013 1527 09/22/2013 0542  NA 142 140  K 4.0 3.9  CL 99 98  CO2 28 25  GLUCOSE 131* 130*  BUN 19 17  CREATININE 1.39* 1.33  CALCIUM 9.5 9.2   Liver Function Tests  Recent Labs  10/20/2013 1527  AST 86*  ALT 16  ALKPHOS 101  BILITOT 0.5  PROT 7.1  ALBUMIN 3.4*    Recent Labs  October 20, 2013 1527  LIPASE 9*   Cardiac Enzymes  Recent Labs  10-20-13 1850 20-Oct-2013 2315 09/20/2013 0542  TROPONINI 15.94* >20.00* >20.00*   Fasting Lipid Panel  Recent Labs  09/23/2013 0542  CHOL 198  HDL 89  LDLCALC 90  TRIG 94  CHOLHDL 2.2   Radiology Studies Imaging results have been reviewed and Ct Abdomen Pelvis W Contrast  10/20/2013   CLINICAL DATA:  Abdominal pain, vomiting, constipation  EXAM: CT ABDOMEN AND PELVIS WITH CONTRAST  TECHNIQUE: Multidetector CT imaging of the abdomen and pelvis was  performed using the standard protocol following bolus administration of intravenous contrast.  CONTRAST:  60mL OMNIPAQUE IOHEXOL 300 MG/ML SOLN, 46mL OMNIPAQUE IOHEXOL 300 MG/ML SOLN  COMPARISON:  02/05/2010  FINDINGS: Osteopenia and degenerative changes thoracolumbar spine. Extensive atherosclerotic calcifications of abdominal aorta, bilateral renal artery and bilateral iliac arteries are noted.  Lung bases shows bilateral probable chronic interstitial prominence and mild fibrotic changes.  Liver, pancreas, spleen and adrenals are unremarkable. Small layering gallstones are noted within gallbladder. No aortic aneurysm. Kidneys are symmetrical in enhancement. There is mild cortical thinning on the left side probable due to mild asymmetric atrophy. Bilateral small renal cysts are noted.  Delayed renal images shows bilateral renal symmetrical excretion. Bilateral visualized proximal ureter is unremarkable. There is no small bowel obstruction.  There is abnormal appearance of the cecum and ileocecal valve. There is polypoid prominence of the ileocecal valve and in axial image 51 There is target like appearance of the cecum. This is best seen in coronal image 44. Findings are highly suspicious for intussusception of the terminal ileum into the cecum. There is mild surrounding mesenteric edema and congested mesenteric vessels. In coronal image 39 there are tiny air bubbles in adjacent mesentery findings may be due to ischemic changes less likely perforation or may represent intraluminal air trapped within intussusception. There is at least partial obstruction at this level.  The urinary bladder is unremarkable. Tiny prostate gland calcifications. No pelvic ascites or free air. No free abdominal air. No destructive bony lesions are noted within pelvis.  IMPRESSION: 1. There is abnormal appearance of the cecum and ileocecal valve. There is polypoid prominence of the ileocecal valve in axial image 51 There is target like  appearance of the cecum. This is best seen in coronal image 44. Findings are highly suspicious for intussusception of the terminal ileum into the cecum. There is mild surrounding mesenteric edema and congested mesenteric vessels. In coronal image 39 there are tiny air bubbles in adjacent mesentery findings may be due to ischemic changes less likely perforation or may represent intraluminal air trapped within intussusception. There is at least partial obstruction at this level. A neoplastic process at ileocecal valve level cannot be entirely excluded. 2. No hydronephrosis or hydroureter. 3. No free abdominal air. 4. Mild a left renal atrophy.  These results were called by telephone at the time of interpretation on 20-Oct-2013 at 6:48 PM to Dr. Jeannett Senior , who verbally acknowledged these results.   Electronically Signed   By: Lahoma Crocker M.D.   On: 2013-10-20 18:48   Dg Abd Acute W/chest  20-Oct-2013   CLINICAL DATA:  Abdominal pain.  Nausea.  Shortness of breath.  EXAM:  ACUTE ABDOMEN SERIES (ABDOMEN 2 VIEW & CHEST 1 VIEW)  COMPARISON:  DG CHEST 2 VIEW dated 06/24/2013; DG CHEST 2 VIEW dated 05/01/2013; DG CHEST 2 VIEW dated 02/28/2013; DG CHEST 2 VIEW dated 02/20/2013; CT ABD/PELVIS W CM dated 02/05/2010  FINDINGS: Gaseous distention of multiple loops of small bowel throughout the abdomen, demonstrating air-fluid levels on the erect image. No evidence of free intraperitoneal air. Gas and liquid stool throughout normal caliber colon. Left renal stent. Numerous pelvic phleboliths. Calcification projected to the left of the L2-3 level is too lateral to represent a urinary tract calculus. No definite opaque urinary tract calculi. Left renal artery stent. Degenerative changes involving the thoracic and lumbar spine.  Cardiac silhouette mildly to moderately enlarged but stable. Diffuse interstitial pulmonary fibrosis and low lung volumes, unchanged. No new pulmonary parenchymal abnormalities.  IMPRESSION: 1. Partial  small bowel obstruction.  No free intraperitoneal air. 2. Stable cardiomegaly. Stable interstitial pulmonary fibrosis with associated low lung volumes. No acute cardiopulmonary disease.   Electronically Signed   By: Evangeline Dakin M.D.   On: 10/05/2013 16:21    TELE Sinus tachy  ECG Evolving anteroseptal MI  ASSESSMENT AND PLAN It appears unlikely he will require urgent abdominal surgery. Labs confirm recent, but completed anterior MI. Echo pending. So far no signs of CHF or ventricular arrhythmia. Discussed with General Surgery - they think he will need a colonoscopy first. Plan coronary angiography today to define anatomy/risk. If LAD occluded, treat medically. If there is reestablished flow and there is a hope for myocardial viability, would perform PCI with a bare metal stent to limit duration of dual antiplatelet therapy, allowing for earlier colonoscopy/biopsy and possible future surgery. Start ACE inhibitor after cath. Increase betablocker dose.   Sanda Klein, MD, [facility] [phone] office [phone] pager 09/13/2013 10:51 AM

## 2013-09-26 NOTE — Progress Notes (Signed)
INITIAL NUTRITION ASSESSMENT  DOCUMENTATION CODES Per approved criteria  -Not Applicable   INTERVENTION: Advance diet when able.  Add supplements accordingly.  NUTRITION DIAGNOSIS: Inadequate oral intake related to altered GI function as evidenced by NPO.   Goal: Pt to meet >/= 90% of their estimated nutrition needs.  Monitor:  Weight trends, labs, I/O's, diet advancement  Reason for Assessment: MST  80 y.o. male  Admitting Dx: Partial small bowel obstruction  ASSESSMENT: 80 y.o. male with a PMH significant for CAD, status post bare-metal stent to the right coronary artery in 2000, hypertension, COPD requiring oxygen supplementation as needed, stage III chronic kidney disease and renal artery stenosis status post angioplasty 2005, previous left lung surgery for a benign mass, hypertension and gout. For last several days he has had severe constipation and he was seen in the [facility] ED 2/16 for presumed fecal impaction. Pt presents to ED 2/17 with worsening abdominal complaints, nausea, vomiting and diarrhea. He has evidence of small bowel obstruction and there is suspicion for an intra-abdominal mass on CT, causing cecal intussusception. Pt also with chest pains related to recent acute anterior wall ST segment elevation myocardial infarction.  Unable to obtain nutrition hx, pt at procedure at this time. Pt was screened by the malnutrition screening tool and was eating poorly because of a decreased appetite. Noted nausea, vomiting, and diarrhea prior to admission. Pt diagnosed with partial small bowel obstruction.   RD spoke with pt's son over the phone, pt eats in the cafeteria at his independent living home. Pt has no swallowing or chewing issues and no significant weight loss. His usual body weight is ~185 lbs.  Height: Ht Readings from Last 1 Encounters:  10/05/2013 5\' 7"  (1.702 m)    Weight: Wt Readings from Last 1 Encounters:  10/06/2013 190 lb 0.6 oz (86.2 kg)    Ideal Body  Weight: 148 lbs  % Ideal Body Weight: 128%  Wt Readings from Last 10 Encounters:   190 lb 0.6 oz (86.2 kg)  05/02/13 189 lb 9.5 oz (86 kg)  03/02/13 187 lb 12.8 oz (85.186 kg)  02/28/13 191 lb (86.637 kg)  02/21/13 184 lb 1.4 oz (83.5 kg)  02/17/13 197 lb (89.359 kg)  12/16/12 194 lb 3.2 oz (88.089 kg)  08/25/12 195 lb 9.6 oz (88.724 kg)  12/18/11 190 lb 6.4 oz (86.365 kg)  10/15/11 190 lb (86.183 kg)    Usual Body Weight: 185 lb  % Usual Body Weight: 103%  BMI:  Body mass index is 29.76 kg/(m^2).  Estimated Nutritional Needs: Kcal: 1800-2000 Protein: 100-110 grams Fluid:1.8 L-2 L/day   Skin: No issues noted  Diet Order: NPO  EDUCATION NEEDS: -No education needs identified at this time   Intake/Output Summary (Last 24 hours) at  1014 Last data filed at 09/24/2013 0700  Gross per 24 hour  Intake    720 ml  Output    450 ml  Net    270 ml    Last BM: 2/16-loose   Labs:   Recent Labs Lab 09/11/2013 1527 09/16/2013 0542  NA 142 140  K 4.0 3.9  CL 99 98  CO2 28 25  BUN 19 17  CREATININE 1.39* 1.33  CALCIUM 9.5 9.2  GLUCOSE 131* 130*    CBG (last 3)  No results found for this basename: GLUCAP,  in the last 72 hours  Scheduled Meds: . aspirin  324 mg Oral NOW   Or  . aspirin  300 mg Rectal NOW  .  aspirin EC  81 mg Oral Daily  . ciprofloxacin  400 mg Intravenous Q12H  . famotidine (PEPCID) IV  20 mg Intravenous QHS  . fluticasone  2 spray Each Nare Daily  . gatifloxacin  1 drop Left Eye 4 times per day  . heparin  2,000 Units Intravenous Once  . metoprolol  5 mg Intravenous 4 times per day  . metronidazole  500 mg Intravenous Q8H  . pantoprazole (PROTONIX) IV  40 mg Intravenous BID  . prednisoLONE acetate  1 drop Left Eye 4 times per day  . sodium chloride  3 mL Intravenous Q12H    Continuous Infusions: . dextrose 5 % and 0.45% NaCl 75 mL/hr at 10/02/2013 0700  . heparin 1,400 Units/hr (09/17/2013 0700)    Past Medical History   Diagnosis Date  . Vertigo   . 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 Hypertension   . GERD (gastroesophageal reflux disease)   . Gout   . RAS (renal artery stenosis)     PCI left renal artery 2005  . Coronary artery disease     a. s/p stent to RCA in 2000;  b. Last CLite 4/07: no ichemia, EF 73%  . CKD (chronic kidney disease) stage 3, GFR 30-59 ml/min   . Anginal pain   . COPD (chronic obstructive pulmonary disease)     2L O2 prn    Past Surgical History  Procedure Laterality Date  . Stent to the righ coronary in 2000    . Lung surgery      benign spot from lung by Dr. Arlyce Dice  . Coronary angioplasty with stent placement    . Appendectomy    . Tonsillectomy    . Hemorroidectomy    . Cataract extraction Left   . Mouth surgery      area removed from mouth - lymphoma per pt - Dr Lucia Gaskins was dr.    Kallie Locks Dietetic Intern Pager: [phone]

## 2013-09-26 NOTE — Consult Note (Signed)
[name] 04-12-1919  [id number].    Requesting MD: Dr. Martinique Chief Complaint/Reason for Consult: Intussusception   HPI:  80 y.o. 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 After his cataract surgery last week, he took some narcotic pain medication which made him constipated. He presented to the ER on 09/24/13 for stool disimpaction after which he had several bowel movements which became diarrhea.  He was discharged home and took miralax before bed, however, he woke up one hour later with nausea, vomiting (nonbilious/nonbloody), and brown loose stools.  He had some streaks of blood on his stools initially which he attributed to rectal trauma from disimpaction.  He also had some aching lower abdominal pain 8/10.  Last meal was Sunday at noon.  He was found to have an acute anterior MI and cardiology has been consulted.  He is currently in the ICU.    General surgery was consulted after finding on CXR/KUB demonstrated partial small bowel obstruction, no free air. CT scan of the abd/pelvis shows abnormal ileum and cecum consistent with intussusception vs ischemic colitis.  He had appendectomy as a teenager, and no other abdominal surgeries. Denies colon cancer or polyps, last colonoscopy was many years ago.  Currently he has no abdominal pain, distension is improved.  He is having a lot of flatus and had a small BM this morning.  He is hungry and would like something to eat/drink.  He complains of continued sternal and upper chest pain.     ROS: All systems reviewed and otherwise negative except for as above  Family History  Problem Relation Age of Onset  . Lung cancer Brother     heavy smoker  . Heart disease Father   . Congestive Heart Failure Father   . Rheum  arthritis Mother   . Brain cancer Brother     Past Medical History  Diagnosis Date  . Vertigo   . 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 Hypertension   . GERD (gastroesophageal reflux disease)   . Gout   . RAS (renal artery stenosis)     PCI left renal artery 2005  . Coronary artery disease     a. s/p stent to RCA in 2000;  b. Last CLite 4/07: no ichemia, EF 73%  . CKD (chronic kidney disease) stage 3, GFR 30-59 ml/min   . Anginal pain   . COPD (chronic obstructive pulmonary disease)     2L O2 prn    Past Surgical History  Procedure Laterality Date  . Stent to the righ coronary in 2000    . Lung surgery      benign spot from lung by Dr. Arlyce Dice  . Coronary angioplasty with stent placement    . Appendectomy      Teenager  . Tonsillectomy  1947  . Hemorroidectomy    . Cataract extraction Left 2015  . Mouth surgery      area removed from mouth - lymphoma per pt - Dr Lucia Gaskins was dr.  . Cataract extraction Right 2000    Social History:  reports that he quit smoking about 32 years ago. His smoking use included Cigarettes. He has a 20 pack-year smoking history. He has never used smokeless  tobacco. He reports that he drinks alcohol. He reports that he does not use illicit drugs.  Allergies:  Allergies  Allergen Reactions  . Penicillins Hives and Rash    Medications Prior to Admission  Medication Sig Dispense Refill  . acetaminophen (TYLENOL) 500 MG tablet Take 500 mg by mouth every 6 (six) hours as needed for pain.       Marland Kitchen allopurinol (ZYLOPRIM) 300 MG tablet Take 300 mg by mouth every morning.       Marland Kitchen amLODipine (NORVASC) 10 MG tablet Take 5 mg by mouth daily.      Marland Kitchen aspirin EC 81 MG tablet Take 81 mg by mouth daily.      . cholecalciferol (VITAMIN D) 1000 UNITS tablet Take 1,000 Units by mouth every morning.      Marland Kitchen dextromethorphan-guaiFENesin (MUCINEX DM) 30-600 MG per 12 hr tablet Take 2 tablets by mouth 2 (two) times daily.      Marland Kitchen docusate sodium  (COLACE) 100 MG capsule Take 1 capsule (100 mg total) by mouth every 12 (twelve) hours.  60 capsule  0  . fluticasone (FLONASE) 50 MCG/ACT nasal spray Place 2 sprays into the nose daily.  16 g  0  . furosemide (LASIX) 40 MG tablet Take 40 mg by mouth daily.      Marland Kitchen guaiFENesin-codeine (ROBITUSSIN AC) 100-10 MG/5ML syrup Take 5 mLs by mouth at bedtime.      Marland Kitchen HYDROcodone-acetaminophen (NORCO/VICODIN) 5-325 MG per tablet Take 1 tablet by mouth every 6 (six) hours as needed for pain.      . meclizine (ANTIVERT) 12.5 MG tablet Take 1 tablet (12.5 mg total) by mouth 3 (three) times daily.  30 tablet  0  . metoprolol succinate (TOPROL-XL) 25 MG 24 hr tablet Take 12.5 mg by mouth daily.      Marland Kitchen moxifloxacin (VIGAMOX) 0.5 % ophthalmic solution Place 1 drop into the left eye 4 (four) times daily.      . Multiple Vitamins-Minerals (ICAPS AREDS FORMULA PO) Take 1 capsule by mouth 2 (two) times daily.        . nitroGLYCERIN (NITROSTAT) 0.4 MG SL tablet Place 1 tablet (0.4 mg total) under the tongue every 5 (five) minutes as needed for chest pain.  25 tablet  3  . omeprazole (PRILOSEC) 20 MG capsule Take 20 mg by mouth 2 (two) times daily.       . polyethylene glycol (MIRALAX / GLYCOLAX) packet Take 17 g by mouth daily.  14 each  0  . prednisoLONE acetate (PRED FORTE) 1 % ophthalmic suspension Place 1 drop into the left eye 4 (four) times daily.      Marland Kitchen PRESCRIPTION MEDICATION Take 2 puffs by mouth daily. Unknown inhaler once daily      . ranitidine (ZANTAC) 150 MG tablet Take 150 mg by mouth 2 (two) times daily.      Marland Kitchen tetrahydrozoline 0.05 % ophthalmic solution Place 1 drop into both eyes as needed (for eye irritation).        Blood pressure 114/90, pulse 99, temperature 98.2 F (36.8 C), temperature source Oral, resp. rate 25, height $RemoveBe'5\' 7"'iUErRAeaC$  (1.702 m), weight 190 lb 0.6 oz (86.2 kg), SpO2 98.00%. Physical Exam: General: pleasant, WD/WN white male who is laying in bed in NAD HEENT: head is normocephalic,  atraumatic.  Sclera are noninjected.  PERRL.  Ears and nose without any masses or lesions.  Mouth is pink and moist Heart: Tachycardic to 115, regular rhythm.  No obvious murmurs noted.  Palpable pedal pulses bilaterally Lungs: CTAB, no wheezes, rhonchi, or rales noted.  Respiratory effort nonlabored Abd: soft, NT/ND, +BS, no rebound or guarding, no masses, hernias, or organomegaly, RLQ well-healed scar from appendectomy, no other abdominal scars MS: all 4 extremities are symmetrical with no cyanosis, clubbing, or edema. Skin: warm and dry with no masses, lesions, or rashes Psych: A&Ox3 with an appropriate affect.   Results for orders placed during the hospital encounter of 09/14/2013 (from the past 48 hour(s))  CBC WITH DIFFERENTIAL     Status: Abnormal   Collection Time    09/10/2013  3:27 PM      Result Value Ref Range   WBC 4.9  4.0 - 10.5 K/uL   RBC 3.84 (*) 4.22 - 5.81 MIL/uL   Hemoglobin 12.9 (*) 13.0 - 17.0 g/dL   HCT 40.5  39.0 - 52.0 %   MCV 105.5 (*) 78.0 - 100.0 fL   MCH 33.6  26.0 - 34.0 pg   MCHC 31.9  30.0 - 36.0 g/dL   RDW 15.2  11.5 - 15.5 %   Platelets 170  150 - 400 K/uL   Neutrophils Relative % 82 (*) 43 - 77 %   Neutro Abs 4.0  1.7 - 7.7 K/uL   Lymphocytes Relative 9 (*) 12 - 46 %   Lymphs Abs 0.4 (*) 0.7 - 4.0 K/uL   Monocytes Relative 9  3 - 12 %   Monocytes Absolute 0.5  0.1 - 1.0 K/uL   Eosinophils Relative 0  0 - 5 %   Eosinophils Absolute 0.0  0.0 - 0.7 K/uL   Basophils Relative 0  0 - 1 %   Basophils Absolute 0.0  0.0 - 0.1 K/uL  COMPREHENSIVE METABOLIC PANEL     Status: Abnormal   Collection Time    10/02/2013  3:27 PM      Result Value Ref Range   Sodium 142  137 - 147 mEq/L   Potassium 4.0  3.7 - 5.3 mEq/L   Chloride 99  96 - 112 mEq/L   CO2 28  19 - 32 mEq/L   Glucose, Bld 131 (*) 70 - 99 mg/dL   BUN 19  6 - 23 mg/dL   Creatinine, Ser 1.39 (*) 0.50 - 1.35 mg/dL   Calcium 9.5  8.4 - 10.5 mg/dL   Total Protein 7.1  6.0 - 8.3 g/dL   Albumin 3.4 (*)  3.5 - 5.2 g/dL   AST 86 (*) 0 - 37 U/L   ALT 16  0 - 53 U/L   Alkaline Phosphatase 101  39 - 117 U/L   Total Bilirubin 0.5  0.3 - 1.2 mg/dL   GFR calc non Af Amer 42 (*) >90 mL/min   GFR calc Af Amer 49 (*) >90 mL/min   Comment: (NOTE)     The eGFR has been calculated using the CKD EPI equation.     This calculation has not been validated in all clinical situations.     eGFR's persistently <90 mL/min signify possible Chronic Kidney     Disease.  LIPASE, BLOOD     Status: Abnormal   Collection Time    09/20/2013  3:27 PM      Result Value Ref Range   Lipase 9 (*) 11 - 59 U/L  CG4 I-STAT (LACTIC ACID)     Status: None   Collection Time    09/19/2013  3:41 PM      Result Value Ref Range   Lactic Acid,  Venous 1.45  0.5 - 2.2 mmol/L  URINALYSIS, ROUTINE W REFLEX MICROSCOPIC     Status: Abnormal   Collection Time    10/04/2013  3:44 PM      Result Value Ref Range   Color, Urine AMBER (*) YELLOW   Comment: BIOCHEMICALS MAY BE AFFECTED BY COLOR   APPearance CLEAR  CLEAR   Specific Gravity, Urine 1.024  1.005 - 1.030   pH 6.0  5.0 - 8.0   Glucose, UA NEGATIVE  NEGATIVE mg/dL   Hgb urine dipstick MODERATE (*) NEGATIVE   Bilirubin Urine NEGATIVE  NEGATIVE   Ketones, ur NEGATIVE  NEGATIVE mg/dL   Protein, ur >300 (*) NEGATIVE mg/dL   Urobilinogen, UA 0.2  0.0 - 1.0 mg/dL   Nitrite NEGATIVE  NEGATIVE   Leukocytes, UA NEGATIVE  NEGATIVE  URINE MICROSCOPIC-ADD ON     Status: Abnormal   Collection Time    09/24/2013  3:44 PM      Result Value Ref Range   Squamous Epithelial / LPF RARE  RARE   RBC / HPF 3-6  <3 RBC/hpf   Casts HYALINE CASTS (*) NEGATIVE  OCCULT BLOOD, POC DEVICE     Status: Abnormal   Collection Time    09/26/2013  3:49 PM      Result Value Ref Range   Fecal Occult Bld POSITIVE (*) NEGATIVE  TROPONIN I     Status: Abnormal   Collection Time    09/26/2013  6:50 PM      Result Value Ref Range   Troponin I 15.94 (*) <0.30 ng/mL   Comment:            Due to the release  kinetics of cTnI,     a negative result within the first hours     of the onset of symptoms does not rule out     myocardial infarction with certainty.     If myocardial infarction is still suspected,     repeat the test at appropriate intervals.     CRITICAL RESULT CALLED TO, READ BACK BY AND VERIFIED WITH:     ALUDWELL RN AT 2025 ON [id number] BY DLONG  POCT I-STAT TROPONIN I     Status: Abnormal   Collection Time    09/10/2013  7:03 PM      Result Value Ref Range   Troponin i, poc 12.51 (*) 0.00 - 0.08 ng/mL   Comment NOTIFIED PHYSICIAN     Comment 3            Comment: Due to the release kinetics of cTnI,     a negative result within the first hours     of the onset of symptoms does not rule out     myocardial infarction with certainty.     If myocardial infarction is still suspected,     repeat the test at appropriate intervals.  HEMOGLOBIN A1C     Status: None   Collection Time    09/20/2013  7:32 PM      Result Value Ref Range   Hemoglobin A1C 5.1  <5.7 %   Comment: (NOTE)  According to the ADA Clinical Practice Recommendations for 2011, when     HbA1c is used as a screening test:      >=6.5%   Diagnostic of Diabetes Mellitus               (if abnormal result is confirmed)     5.7-6.4%   Increased risk of developing Diabetes Mellitus     References:Diagnosis and Classification of Diabetes Mellitus,Diabetes     UVOZ,3664,40(HKVQQ 1):S62-S69 and Standards of Medical Care in             Diabetes - 2011,Diabetes VZDG,3875,64 (Suppl 1):S11-S61.   Mean Plasma Glucose 100  <117 mg/dL   Comment: Performed at [place]     Status: None   Collection Time    09/17/2013  7:33 PM      Result Value Ref Range   Cholesterol 196  0 - 200 mg/dL   Triglycerides 95  <150 mg/dL   HDL 84  >39 mg/dL   Total CHOL/HDL Ratio 2.3     VLDL 19  0 - 40 mg/dL   LDL Cholesterol 93  0 - 99 mg/dL    Comment:            Total Cholesterol/HDL:CHD Risk     Coronary Heart Disease Risk Table                         Men   Women      1/2 Average Risk   3.4   3.3      Average Risk       5.0   4.4      2 X Average Risk   9.6   7.1      3 X Average Risk  23.4   11.0                Use the calculated Patient Ratio     above and the CHD Risk Table     to determine the patient's CHD Risk.                ATP III CLASSIFICATION (LDL):      <100     mg/dL   Optimal      100-129  mg/dL   Near or Above                        Optimal      130-159  mg/dL   Borderline      160-189  mg/dL   High      >190     mg/dL   Very High     Performed at [place]     Status: None   Collection Time    10/04/2013  8:50 PM      Result Value Ref Range   Prothrombin Time 12.1  11.6 - 15.2 seconds   INR 0.91  0.00 - 1.49  APTT     Status: None   Collection Time    10/02/2013  8:50 PM      Result Value Ref Range   aPTT 34  24 - 37 seconds  MRSA PCR SCREENING     Status: None   Collection Time    10/03/2013 11:03 PM      Result Value Ref Range   MRSA by PCR NEGATIVE  NEGATIVE   Comment:  The GeneXpert MRSA Assay (FDA     approved for NASAL specimens     only), is one component of a     comprehensive MRSA colonization     surveillance program. It is not     intended to diagnose MRSA     infection nor to guide or     monitor treatment for     MRSA infections.  TROPONIN I     Status: Abnormal   Collection Time    09/21/2013 11:15 PM      Result Value Ref Range   Troponin I >20.00 (*) <0.30 ng/mL   Comment:            Due to the release kinetics of cTnI,     a negative result within the first hours     of the onset of symptoms does not rule out     myocardial infarction with certainty.     If myocardial infarction is still suspected,     repeat the test at appropriate intervals.     CRITICAL RESULT CALLED TO, READ BACK BY AND VERIFIED WITH:     PARRISH,J RN 09/17/2013  0002 JORDANS  CLOSTRIDIUM DIFFICILE BY PCR     Status: None   Collection Time    09/12/2013  1:23 AM      Result Value Ref Range   C difficile by pcr NEGATIVE  NEGATIVE  BASIC METABOLIC PANEL     Status: Abnormal   Collection Time    09/23/2013  5:42 AM      Result Value Ref Range   Sodium 140  137 - 147 mEq/L   Potassium 3.9  3.7 - 5.3 mEq/L   Chloride 98  96 - 112 mEq/L   CO2 25  19 - 32 mEq/L   Glucose, Bld 130 (*) 70 - 99 mg/dL   BUN 17  6 - 23 mg/dL   Creatinine, Ser 1.33  0.50 - 1.35 mg/dL   Calcium 9.2  8.4 - 10.5 mg/dL   GFR calc non Af Amer 45 (*) >90 mL/min   GFR calc Af Amer 52 (*) >90 mL/min   Comment: (NOTE)     The eGFR has been calculated using the CKD EPI equation.     This calculation has not been validated in all clinical situations.     eGFR's persistently <90 mL/min signify possible Chronic Kidney     Disease.  TROPONIN I     Status: Abnormal   Collection Time    10/02/2013  5:42 AM      Result Value Ref Range   Troponin I >20.00 (*) <0.30 ng/mL   Comment:            Due to the release kinetics of cTnI,     a negative result within the first hours     of the onset of symptoms does not rule out     myocardial infarction with certainty.     If myocardial infarction is still suspected,     repeat the test at appropriate intervals.     CRITICAL VALUE NOTED.  VALUE IS CONSISTENT WITH PREVIOUSLY REPORTED AND CALLED VALUE.  CBC WITH DIFFERENTIAL     Status: Abnormal   Collection Time    09/30/2013  5:42 AM      Result Value Ref Range   WBC 8.1  4.0 - 10.5 K/uL   RBC 3.94 (*) 4.22 - 5.81 MIL/uL   Hemoglobin 13.5  13.0 - 17.0 g/dL  HCT 42.3  39.0 - 52.0 %   MCV 107.4 (*) 78.0 - 100.0 fL   MCH 34.3 (*) 26.0 - 34.0 pg   MCHC 31.9  30.0 - 36.0 g/dL   RDW 15.3  11.5 - 15.5 %   Platelets 173  150 - 400 K/uL   Neutrophils Relative % 78 (*) 43 - 77 %   Neutro Abs 6.3  1.7 - 7.7 K/uL   Lymphocytes Relative 10 (*) 12 - 46 %   Lymphs Abs 0.8  0.7 - 4.0 K/uL    Monocytes Relative 12  3 - 12 %   Monocytes Absolute 0.9  0.1 - 1.0 K/uL   Eosinophils Relative 1  0 - 5 %   Eosinophils Absolute 0.0  0.0 - 0.7 K/uL   Basophils Relative 0  0 - 1 %   Basophils Absolute 0.0  0.0 - 0.1 K/uL  PRO B NATRIURETIC PEPTIDE     Status: Abnormal   Collection Time      5:42 AM      Result Value Ref Range   Pro B Natriuretic peptide (BNP) 30802.0 (*) 0 - 450 pg/mL  HEPARIN LEVEL (UNFRACTIONATED)     Status: Abnormal   Collection Time    10/02/2013  5:42 AM      Result Value Ref Range   Heparin Unfractionated 0.12 (*) 0.30 - 0.70 IU/mL   Comment:            IF HEPARIN RESULTS ARE BELOW     EXPECTED VALUES, AND PATIENT     DOSAGE HAS BEEN CONFIRMED,     SUGGEST FOLLOW UP TESTING     OF ANTITHROMBIN III LEVELS.  LIPID PANEL     Status: None   Collection Time    09/17/2013  5:42 AM      Result Value Ref Range   Cholesterol 198  0 - 200 mg/dL   Triglycerides 94  <150 mg/dL   HDL 89  >39 mg/dL   Total CHOL/HDL Ratio 2.2     VLDL 19  0 - 40 mg/dL   LDL Cholesterol 90  0 - 99 mg/dL   Comment:            Total Cholesterol/HDL:CHD Risk     Coronary Heart Disease Risk Table                         Men   Women      1/2 Average Risk   3.4   3.3      Average Risk       5.0   4.4      2 X Average Risk   9.6   7.1      3 X Average Risk  23.4   11.0                Use the calculated Patient Ratio     above and the CHD Risk Table     to determine the patient's CHD Risk.                ATP III CLASSIFICATION (LDL):      <100     mg/dL   Optimal      100-129  mg/dL   Near or Above                        Optimal      130-159  mg/dL  Borderline      160-189  mg/dL   High      >190     mg/dL   Very High   Ct Abdomen Pelvis W Contrast  09/22/2013   CLINICAL DATA:  Abdominal pain, vomiting, constipation  EXAM: CT ABDOMEN AND PELVIS WITH CONTRAST  TECHNIQUE: Multidetector CT imaging of the abdomen and pelvis was performed using the standard protocol following  bolus administration of intravenous contrast.  CONTRAST:  34mL OMNIPAQUE IOHEXOL 300 MG/ML SOLN, 46mL OMNIPAQUE IOHEXOL 300 MG/ML SOLN  COMPARISON:  02/05/2010  FINDINGS: Osteopenia and degenerative changes thoracolumbar spine. Extensive atherosclerotic calcifications of abdominal aorta, bilateral renal artery and bilateral iliac arteries are noted.  Lung bases shows bilateral probable chronic interstitial prominence and mild fibrotic changes.  Liver, pancreas, spleen and adrenals are unremarkable. Small layering gallstones are noted within gallbladder. No aortic aneurysm. Kidneys are symmetrical in enhancement. There is mild cortical thinning on the left side probable due to mild asymmetric atrophy. Bilateral small renal cysts are noted.  Delayed renal images shows bilateral renal symmetrical excretion. Bilateral visualized proximal ureter is unremarkable. There is no small bowel obstruction.  There is abnormal appearance of the cecum and ileocecal valve. There is polypoid prominence of the ileocecal valve and in axial image 51 There is target like appearance of the cecum. This is best seen in coronal image 44. Findings are highly suspicious for intussusception of the terminal ileum into the cecum. There is mild surrounding mesenteric edema and congested mesenteric vessels. In coronal image 39 there are tiny air bubbles in adjacent mesentery findings may be due to ischemic changes less likely perforation or may represent intraluminal air trapped within intussusception. There is at least partial obstruction at this level.  The urinary bladder is unremarkable. Tiny prostate gland calcifications. No pelvic ascites or free air. No free abdominal air. No destructive bony lesions are noted within pelvis.  IMPRESSION: 1. There is abnormal appearance of the cecum and ileocecal valve. There is polypoid prominence of the ileocecal valve in axial image 51 There is target like appearance of the cecum. This is best seen in  coronal image 44. Findings are highly suspicious for intussusception of the terminal ileum into the cecum. There is mild surrounding mesenteric edema and congested mesenteric vessels. In coronal image 39 there are tiny air bubbles in adjacent mesentery findings may be due to ischemic changes less likely perforation or may represent intraluminal air trapped within intussusception. There is at least partial obstruction at this level. A neoplastic process at ileocecal valve level cannot be entirely excluded. 2. No hydronephrosis or hydroureter. 3. No free abdominal air. 4. Mild a left renal atrophy.  These results were called by telephone at the time of interpretation on 09/23/2013 at 6:48 PM to Dr. Jeannett Senior , who verbally acknowledged these results.   Electronically Signed   By: Lahoma Crocker M.D.   On: 10/06/2013 18:48   Dg Abd Acute W/chest  09/14/2013   CLINICAL DATA:  Abdominal pain.  Nausea.  Shortness of breath.  EXAM: ACUTE ABDOMEN SERIES (ABDOMEN 2 VIEW & CHEST 1 VIEW)  COMPARISON:  DG CHEST 2 VIEW dated 06/24/2013; DG CHEST 2 VIEW dated 05/01/2013; DG CHEST 2 VIEW dated 02/28/2013; DG CHEST 2 VIEW dated 02/20/2013; CT ABD/PELVIS W CM dated 02/05/2010  FINDINGS: Gaseous distention of multiple loops of small bowel throughout the abdomen, demonstrating air-fluid levels on the erect image. No evidence of free intraperitoneal air. Gas and liquid stool throughout normal caliber colon.  Left renal stent. Numerous pelvic phleboliths. Calcification projected to the left of the L2-3 level is too lateral to represent a urinary tract calculus. No definite opaque urinary tract calculi. Left renal artery stent. Degenerative changes involving the thoracic and lumbar spine.  Cardiac silhouette mildly to moderately enlarged but stable. Diffuse interstitial pulmonary fibrosis and low lung volumes, unchanged. No new pulmonary parenchymal abnormalities.  IMPRESSION: 1. Partial small bowel obstruction.  No free  intraperitoneal air. 2. Stable cardiomegaly. Stable interstitial pulmonary fibrosis with associated low lung volumes. No acute cardiopulmonary disease.   Electronically Signed   By: Evangeline Dakin M.D.   On:  16:21      Assessment/Plan Abdominal pain, distension  Abnormal cecum and ileum - concerning for pSBO secondary to intussusception or possible ischemic colitis on CT Nausea/vomiting Acute anterior wall ST segment elevation MI H/o HTN, GERD, COPD, CKD stage 3 with RAS, gout, vertigo  Plan: 1.  Symptoms and physical exam seemed to be improved compared to yesterday per patient and Dr. Sallyanne Kuster who saw him yesterday.  He's having flatus and BM's.  His pain is resolved.  He continues to have some pain in his sternum and upper chest.  He does not have an acute abdomen at this time.  His WBC is normal, his exam is unimpressive.   2.  Would treat conservative at this point given his improvement.  Allow clear liquid diet after procedure.  Advance slowly as tolerated. 3.  No need for urgent/emergent surgery at this point and if he continues to improve would not see a need for it.  At this point his heart is very important and would recommend proceeding with coronary angiography 4.  Will obtain repeat plain films and recommend GI consultation for colonoscopy to rule out mass as a lead point for possible intussusception.  Lactic acid level would probably not be helpful in this setting. 5.  SCD's and heparin 6.  Ambulate and IS 7.  Will follow with you    Coralie Keens, [facility] 09/13/2013, 11:26 AM Pager: [phone]

## 2013-09-27 ENCOUNTER — Encounter (HOSPITAL_COMMUNITY): Admission: EM | Disposition: E | Payer: Self-pay | Source: Home / Self Care | Attending: Cardiology

## 2013-09-27 DIAGNOSIS — R570 Cardiogenic shock: Secondary | ICD-10-CM

## 2013-09-27 DIAGNOSIS — J811 Chronic pulmonary edema: Secondary | ICD-10-CM

## 2013-09-27 LAB — BASIC METABOLIC PANEL
BUN: 22 mg/dL (ref 6–23)
CHLORIDE: 98 meq/L (ref 96–112)
CO2: 28 meq/L (ref 19–32)
CREATININE: 1.68 mg/dL — AB (ref 0.50–1.35)
Calcium: 8.8 mg/dL (ref 8.4–10.5)
GFR calc Af Amer: 39 mL/min — ABNORMAL LOW (ref >90)
GFR calc non Af Amer: 34 mL/min — ABNORMAL LOW (ref >90)
Glucose, Bld: 100 mg/dL — ABNORMAL HIGH (ref 70–99)
Potassium: 3.8 mEq/L (ref 3.7–5.3)
SODIUM: 137 meq/L (ref 137–147)

## 2013-09-27 LAB — CBC WITH DIFFERENTIAL/PLATELET
BASOS PCT: 0 % (ref 0–1)
Basophils Absolute: 0 10*3/uL (ref 0.0–0.1)
Eosinophils Absolute: 0.1 10*3/uL (ref 0.0–0.7)
Eosinophils Relative: 1 % (ref 0–5)
HCT: 38.3 % — ABNORMAL LOW (ref 39.0–52.0)
Hemoglobin: 12 g/dL — ABNORMAL LOW (ref 13.0–17.0)
LYMPHS PCT: 14 % (ref 12–46)
Lymphs Abs: 1.5 10*3/uL (ref 0.7–4.0)
MCH: 33.7 pg (ref 26.0–34.0)
MCHC: 31.3 g/dL (ref 30.0–36.0)
MCV: 107.6 fL — ABNORMAL HIGH (ref 78.0–100.0)
Monocytes Absolute: 1.4 10*3/uL — ABNORMAL HIGH (ref 0.1–1.0)
Monocytes Relative: 13 % — ABNORMAL HIGH (ref 3–12)
NEUTROS ABS: 7.8 10*3/uL — AB (ref 1.7–7.7)
NEUTROS PCT: 72 % (ref 43–77)
PLATELETS: 159 10*3/uL (ref 150–400)
RBC: 3.56 MIL/uL — ABNORMAL LOW (ref 4.22–5.81)
RDW: 15.1 % (ref 11.5–15.5)
WBC: 10.7 10*3/uL — AB (ref 4.0–10.5)

## 2013-09-27 LAB — HEPARIN LEVEL (UNFRACTIONATED)
HEPARIN UNFRACTIONATED: 0.27 [IU]/mL — AB (ref 0.30–0.70)
Heparin Unfractionated: 0.3 IU/mL (ref 0.30–0.70)

## 2013-09-27 SURGERY — LEFT HEART CATHETERIZATION WITH CORONARY ANGIOGRAM
Anesthesia: LOCAL

## 2013-09-27 MED ORDER — NITROGLYCERIN IN D5W 200-5 MCG/ML-% IV SOLN: 5.0000 ug/min | INTRAVENOUS | Status: DC

## 2013-09-27 MED ORDER — MORPHINE SULFATE 2 MG/ML IJ SOLN
2.0000 mg | INTRAMUSCULAR | Status: DC | PRN
  Administered 2013-09-27 (×3): 2 mg via INTRAVENOUS
  Filled 2013-09-27 (×3): qty 1

## 2013-09-27 MED ORDER — HALOPERIDOL LACTATE 5 MG/ML IJ SOLN
0.5000 mg | Freq: Once | INTRAMUSCULAR | Status: AC
  Administered 2013-09-27: 0.5 mg via INTRAVENOUS

## 2013-09-27 MED ORDER — LORAZEPAM 2 MG/ML IJ SOLN
INTRAMUSCULAR | Status: AC
  Filled 2013-09-27: qty 1

## 2013-09-27 MED ORDER — FUROSEMIDE 10 MG/ML IJ SOLN
80.0000 mg | Freq: Two times a day (BID) | INTRAMUSCULAR | Status: DC
  Administered 2013-09-27 (×2): 80 mg via INTRAVENOUS
  Filled 2013-09-27 (×2): qty 8

## 2013-09-27 MED ORDER — LORAZEPAM 2 MG/ML IJ SOLN
0.5000 mg | Freq: Once | INTRAMUSCULAR | Status: AC
  Administered 2013-09-27: 0.5 mg via INTRAVENOUS

## 2013-09-27 MED ORDER — METOPROLOL TARTRATE 1 MG/ML IV SOLN: 5.0000 mg | Freq: Four times a day (QID) | INTRAVENOUS | Status: DC | PRN

## 2013-09-27 MED ORDER — NITROGLYCERIN IN D5W 200-5 MCG/ML-% IV SOLN
INTRAVENOUS | Status: AC
  Filled 2013-09-27: qty 250

## 2013-09-27 MED ORDER — HALOPERIDOL LACTATE 5 MG/ML IJ SOLN
5.0000 mg | Freq: Once | INTRAMUSCULAR | Status: DC
  Filled 2013-09-27: qty 1

## 2013-09-27 MED ORDER — HALOPERIDOL LACTATE 5 MG/ML IJ SOLN
INTRAMUSCULAR | Status: AC
  Filled 2013-09-27: qty 1

## 2013-09-27 MED ORDER — CIPROFLOXACIN IN D5W 400 MG/200ML IV SOLN
400.0000 mg | INTRAVENOUS | Status: DC
  Filled 2013-09-27: qty 200

## 2013-09-27 NOTE — Progress Notes (Signed)
Pt's HR rapidly decreased from 120s-40s and then to Asystole. BP and respirations were absent. Family and Attending physician notified.   2 RNs verified there were no heart or respiration sounds. Wyline Beady, RN and Neysa Hotter, Therapist, sports. Cardiologist Balfour also at bedside.  Time of death 2127-12-03.   Family at bedside.

## 2013-09-27 NOTE — Progress Notes (Signed)
1930 pt became extremely agitated attempting to climb out of bed, confused and combative. HR increased to ST 130-140s. O2 sats decreased to mid 80s on Non-rebreather. Cardiologist paged, 0.5mg  IV Ativan ordered and given.

## 2013-09-27 NOTE — Progress Notes (Signed)
ANTICOAGULATION CONSULT NOTE - Follow Up Consult  Pharmacy Consult for Heparin  Indication: chest pain/ACS  Allergies  Allergen Reactions  . Penicillins Hives and Rash    Patient Measurements: Height: 5\' 7"  (170.2 cm) Weight: 190 lb 0.6 oz (86.2 kg) IBW/kg (Calculated) : 66.1  Vital Signs: Temp: 97.5 F (36.4 C) (02/17 1946) Temp src: Oral (02/17 1946) BP: 85/49 mmHg (02/17 2100) Pulse Rate: 90 (02/17 2100)  Labs:  Recent Labs  09/17/2013 1527  09/24/2013 1850 09/21/2013 2050 09/14/2013 2315 09/23/2013 0542 10/03/2013 1150 09/11/2013 1420 10/04/2013 2225  HGB 12.9*  --   --   --   --  13.5  --   --   --   HCT 40.5  --   --   --   --  42.3  --   --   --   PLT 170  --   --   --   --  173  --   --   --   APTT  --   --   --  34  --   --   --   --   --   LABPROT  --   --   --  12.1  --   --   --   --   --   INR  --   --   --  0.91  --   --   --   --   --   HEPARINUNFRC  --   --   --   --   --  0.12*  --  0.24* 0.27*  CREATININE 1.39*  --   --   --   --  1.33  --   --   --   TROPONINI  --   < > 15.94*  --  >20.00* >20.00* 18.77*  --   --   < > = values in this interval not displayed.  Estimated Creatinine Clearance: 37.1 ml/min (by C-G formula based on Cr of 1.33).   Medications:  Heparin 1550 units/hr  Assessment: 80 y/o M on heparin for STEMI, HL 0.27 after rate increase, other labs as above.   Goal of Therapy:  Heparin level 0.3-0.7 units/ml Monitor platelets by anticoagulation protocol: Yes   Plan:  -Increase heparin drip to 1700 units/hr -0900 HL -Daily CBC/HL -Monitor for bleeding  Narda Bonds 09/25/2013,12:06 AM

## 2013-09-27 NOTE — Progress Notes (Signed)
On NRB. bp labile. Apparently some confusion reqing sitter. Pt denies abd pain, n/v. Also reports a BM but sitter denies Denies cp. Some SOB  Awake, alert, not in resp distress ox4 Soft, a little protuberant, nt  TI/Cecal intussusception causing psbo Appears to be resolving Concern is for mass as lead point Peridot with clears today if ok with cards - unsure if pt going to have cath today Will need GI consultation at [employer]. Redmond Pulling, MD, FACS General, Bariatric, & Minimally Invasive Surgery [facility], [place]

## 2013-09-27 NOTE — Plan of Care (Signed)
Death Summary:  Principal Problem:  Partial small bowel obstruction  Active Problems:  Hypertension  CKD (chronic kidney disease) stage 3, GFR 30-59 ml/min  Nausea and vomiting  CAD (coronary artery disease)  Elevated troponin  Acute MI, anterior wall, initial episode of care    Mr. [name] is a 70M who was admitted and sufferring extensive anterior, septal and lateral MI.  This was complicated by partial small bowel obstruction, CKD, and worsening hemodynamic status leading to pulmonary edema and hypotension.  He was made DNR today by family.  This evening patient became severely agitated.  He subsequently became bradycardic and stopped breathing.  Patient progressed to agonal respirations.  He was evaluated by me and found to have no pulse and no breath sounds.  He was pronounced dead at 9:27pm.  Family has been contacted.

## 2013-09-27 NOTE — Progress Notes (Signed)
Patient Name: [name] Date of Encounter: 12-Oct-2013  Principal Problem:   Partial small bowel obstruction Active Problems:   Hypertension   CKD (chronic kidney disease) stage 3, GFR 30-59 ml/min   Nausea and vomiting   CAD (coronary artery disease)   Elevated troponin   Acute MI, anterior wall, initial episode of care   Length of Stay: 2  SUBJECTIVE  Orthopnea, dyspnea when moving around in bed. Mediocre response to diuretics. Despite positive fluid balance, renal function is worse. Mildly hypotensive overnight, slightly better this AM. Confused and combative overnight, fairly lucid this AM.  CURRENT MEDS . aspirin EC  81 mg Oral Daily  . ciprofloxacin  400 mg Intravenous Q12H  . famotidine (PEPCID) IV  20 mg Intravenous QHS  . fluticasone  2 spray Each Nare Daily  . furosemide  80 mg Intravenous BID  . gatifloxacin  1 drop Left Eye 4 times per day  . heparin  2,000 Units Intravenous Once  . metoprolol  5 mg Intravenous 4 times per day  . metronidazole  500 mg Intravenous Q8H  . pantoprazole (PROTONIX) IV  40 mg Intravenous BID  . prednisoLONE acetate  1 drop Left Eye 4 times per day  . sodium chloride  3 mL Intravenous Q12H  . sodium chloride  3 mL Intravenous Q12H    OBJECTIVE   Intake/Output Summary (Last 24 hours) at 10-12-13 0908 Last data filed at October 12, 2013 0800  Gross per 24 hour  Intake 2416.5 ml  Output    825 ml  Net 1591.5 ml   Filed Weights   09/17/2013 2040 09/26/2013 0600 10-12-2013 0602  Weight: 79.379 kg (175 lb) 86.2 kg (190 lb 0.6 oz) 82.8 kg (182 lb 8.7 oz)    PHYSICAL EXAM Filed Vitals:   10/12/13 0700 2013/10/12 0800 October 12, 2013 0812 2013-10-12 0900  BP: 91/61 130/78  108/75  Pulse: 102 110  99  Temp:   97.7 F (36.5 C)   TempSrc:   Oral   Resp: 25 26  20   Height:      Weight:      SpO2: 98% 96%  97%   General: Alert, oriented x3, no distress Head: no evidence of trauma, PERRL, EOMI, no exophtalmos or lid lag, no myxedema, no  xanthelasma; normal ears, nose and oropharynx Neck: normal jugular venous pulsations and no hepatojugular reflux; brisk carotid pulses without delay and no carotid bruits Chest: bilateral rales, no signs of consolidation by percussion or palpation, symmetrical and full respiratory excursions Cardiovascular: normal position and quality of the apical impulse, regular rhythm, normal first and second heart sounds, no rubs or gallops, no murmur Abdomen: no tenderness or distention, no masses by palpation, no abnormal pulsatility or arterial bruits, normal bowel sounds, no hepatosplenomegaly Extremities: no clubbing, cyanosis or edema; 2+ radial, ulnar and brachial pulses bilaterally; 2+ right femoral, posterior tibial and dorsalis pedis pulses; 2+ left femoral, posterior tibial and dorsalis pedis pulses; no subclavian or femoral bruits Neurological: grossly nonfocal  LABS  CBC  Recent Labs  10/03/2013 0542 10-12-13 0306  WBC 8.1 10.7*  NEUTROABS 6.3 7.8*  HGB 13.5 12.0*  HCT 42.3 38.3*  MCV 107.4* 107.6*  PLT 173 616   Basic Metabolic Panel  Recent Labs  10/06/2013 0542 12-Oct-2013 0306  NA 140 137  K 3.9 3.8  CL 98 98  CO2 25 28  GLUCOSE 130* 100*  BUN 17 22  CREATININE 1.33 1.68*  CALCIUM 9.2 8.8   Liver Function Tests  Recent  Labs  10-08-13 1527  AST 86*  ALT 16  ALKPHOS 101  BILITOT 0.5  PROT 7.1  ALBUMIN 3.4*    Recent Labs  10-08-13 1527  LIPASE 9*   Cardiac Enzymes  Recent Labs  October 08, 2013 2315 09/21/2013 0542  1150  TROPONINI >20.00* >20.00* 18.77*   BNP No components found with this basename: POCBNP,  D-Dimer No results found for this basename: DDIMER,  in the last 72 hours Hemoglobin A1C  Recent Labs  2013-10-08 1932  HGBA1C 5.1   Fasting Lipid Panel  Recent Labs  10/03/2013 0542  CHOL 198  HDL 89  LDLCALC 90  TRIG 94  CHOLHDL 2.2   Radiology Studies Imaging results have been reviewed and Ct Abdomen Pelvis W Contrast  10/08/2013    CLINICAL DATA:  Abdominal pain, vomiting, constipation  EXAM: CT ABDOMEN AND PELVIS WITH CONTRAST  TECHNIQUE: Multidetector CT imaging of the abdomen and pelvis was performed using the standard protocol following bolus administration of intravenous contrast.  CONTRAST:  64mL OMNIPAQUE IOHEXOL 300 MG/ML SOLN, 12mL OMNIPAQUE IOHEXOL 300 MG/ML SOLN  COMPARISON:  02/05/2010  FINDINGS: Osteopenia and degenerative changes thoracolumbar spine. Extensive atherosclerotic calcifications of abdominal aorta, bilateral renal artery and bilateral iliac arteries are noted.  Lung bases shows bilateral probable chronic interstitial prominence and mild fibrotic changes.  Liver, pancreas, spleen and adrenals are unremarkable. Small layering gallstones are noted within gallbladder. No aortic aneurysm. Kidneys are symmetrical in enhancement. There is mild cortical thinning on the left side probable due to mild asymmetric atrophy. Bilateral small renal cysts are noted.  Delayed renal images shows bilateral renal symmetrical excretion. Bilateral visualized proximal ureter is unremarkable. There is no small bowel obstruction.  There is abnormal appearance of the cecum and ileocecal valve. There is polypoid prominence of the ileocecal valve and in axial image 51 There is target like appearance of the cecum. This is best seen in coronal image 44. Findings are highly suspicious for intussusception of the terminal ileum into the cecum. There is mild surrounding mesenteric edema and congested mesenteric vessels. In coronal image 39 there are tiny air bubbles in adjacent mesentery findings may be due to ischemic changes less likely perforation or may represent intraluminal air trapped within intussusception. There is at least partial obstruction at this level.  The urinary bladder is unremarkable. Tiny prostate gland calcifications. No pelvic ascites or free air. No free abdominal air. No destructive bony lesions are noted within pelvis.   IMPRESSION: 1. There is abnormal appearance of the cecum and ileocecal valve. There is polypoid prominence of the ileocecal valve in axial image 51 There is target like appearance of the cecum. This is best seen in coronal image 44. Findings are highly suspicious for intussusception of the terminal ileum into the cecum. There is mild surrounding mesenteric edema and congested mesenteric vessels. In coronal image 39 there are tiny air bubbles in adjacent mesentery findings may be due to ischemic changes less likely perforation or may represent intraluminal air trapped within intussusception. There is at least partial obstruction at this level. A neoplastic process at ileocecal valve level cannot be entirely excluded. 2. No hydronephrosis or hydroureter. 3. No free abdominal air. 4. Mild a left renal atrophy.  These results were called by telephone at the time of interpretation on 2013/10/08 at 6:48 PM to Dr. Jeannett Senior , who verbally acknowledged these results.   Electronically Signed   By: Lahoma Crocker M.D.   On: 2013/10/08 18:48   Dg Abd Acute  W/chest  09/11/2013   CLINICAL DATA:  Abdominal pain.  Nausea.  Shortness of breath.  EXAM: ACUTE ABDOMEN SERIES (ABDOMEN 2 VIEW & CHEST 1 VIEW)  COMPARISON:  DG CHEST 2 VIEW dated 06/24/2013; DG CHEST 2 VIEW dated 05/01/2013; DG CHEST 2 VIEW dated 02/28/2013; DG CHEST 2 VIEW dated 02/20/2013; CT ABD/PELVIS W CM dated 02/05/2010  FINDINGS: Gaseous distention of multiple loops of small bowel throughout the abdomen, demonstrating air-fluid levels on the erect image. No evidence of free intraperitoneal air. Gas and liquid stool throughout normal caliber colon. Left renal stent. Numerous pelvic phleboliths. Calcification projected to the left of the L2-3 level is too lateral to represent a urinary tract calculus. No definite opaque urinary tract calculi. Left renal artery stent. Degenerative changes involving the thoracic and lumbar spine.  Cardiac silhouette mildly to  moderately enlarged but stable. Diffuse interstitial pulmonary fibrosis and low lung volumes, unchanged. No new pulmonary parenchymal abnormalities.  IMPRESSION: 1. Partial small bowel obstruction.  No free intraperitoneal air. 2. Stable cardiomegaly. Stable interstitial pulmonary fibrosis with associated low lung volumes. No acute cardiopulmonary disease.   Electronically Signed   By: Evangeline Dakin M.D.   On: 09/18/2013 16:21   Dg Abd Portable 2v  09/21/2013   CLINICAL DATA:  Small-bowel obstruction  EXAM: PORTABLE ABDOMEN - 2 VIEW  COMPARISON:  CT from the previous day  FINDINGS: Scattered large and small bowel gas is noted. No obstructive changes are seen. No free air is noted. Chronic changes are noted in the bases bilaterally. Degenerative changes of the lumbar spine are again seen. A left renal stent is again noted.  IMPRESSION: No definitive obstructive change.   Electronically Signed   By: Inez Catalina M.D.   On: 09/11/2013 13:00    TELE STachy, PACs, no VT  ECG ST elevation has progressed to all precordial leads, but diminishing in amplitude  ECHO Extensive wall motion abnormalities involve both the LAD and LCX territories EF 20-25%  ASSESSMENT AND PLAN Substantially worsened over last 24h, now with evidence of pulmonary edema and hypotension/reduced CO - Killip class IV. Completed extensive anterior, septal and lateral MI. Not appropriate for cardiac cath. Discussed poor prognosis with the patient and his son. Recommended DNR status/palliative care. He actually has a previously redacted Living Will according to his son, [name]. I suggested we do not perform CPR/defibrillation and/or mechanical ventilation if he should reach that point. He will talk it over with his family.   Sanda Klein, MD, [facility] [phone] office [phone] pager  9:08 AM

## 2013-09-27 NOTE — Progress Notes (Signed)
Clinical Social Work Department BRIEF PSYCHOSOCIAL ASSESSMENT 09/18/2013  Patient:  KEVIS, QU     Account Number:  [id number]     Admit date:  09/13/2013  Clinical Social Worker:  Megan Salon  Date/Time:  09/15/2013 02:29 PM  Referred by:  CSW  Date Referred:  09/15/2013 Referred for  Psychosocial assessment   Other Referral:   Interview type:  Family Other interview type:   CSW spoke to patient's son over the phone    PSYCHOSOCIAL DATA Living Status:  FACILITY Admitted from facility:  [facility] Level of care:  Independent Living Primary support name:  Jori Moll Cost Primary support relationship to patient:  CHILD, ADULT Degree of support available:   Good    CURRENT CONCERNS Current Concerns  Post-Acute Placement   Other Concerns:    SOCIAL WORK ASSESSMENT / PLAN CSW received phone call from [facility] to inform social worker that patient is from [facility]. CSW reviewed chart and noticed patient only alert to [place]. CSW called patient's son and explained reason for visit. Patient's son confirmed that patient was from [facility], but "is unsure if patient will go back at this time, due to his health." Patient's son explained that patient is not doing to well. CSW provided support and stated social worker will check in with patient's family and provide support as needed.   Assessment/plan status:  Psychosocial Support/Ongoing Assessment of Needs Other assessment/ plan:   Information/referral to community resources:   CSW contact information    PATIENT'S/FAMILY'S RESPONSE TO PLAN OF CARE: Patient's son is unsure of plan of care at this time and CSW will stand by and assist as needed.       Jeanette Caprice, MSW, [place]

## 2013-09-27 NOTE — Progress Notes (Signed)
[place] Progress Note Patient Name: [name] DOB: 06/21/1919 MRN: [id number]  Date of Service  09/20/2013   HPI/Events of Note   Mr. Clayburn became bradycardic and stopped breathing not long after I was called for combativeness.  Now with agonal respirations but not showing signs of distress.  Never received haldol.  eICU Interventions  Family notified.   Intervention Category Major Interventions: End of life / care limitation discussion  MCQUAID, DOUGLAS 09/11/2013, 9:22 PM

## 2013-09-27 NOTE — Progress Notes (Signed)
Pharmacist Heart Failure Core Measure Documentation  Assessment: [name] has an EF documented as 20-25% on 2/17 by ECHO.  Rationale: Heart failure patients with left ventricular systolic dysfunction (LVSD) and an EF < 40% should be prescribed an angiotensin converting enzyme inhibitor (ACEI) or angiotensin receptor blocker (ARB) at discharge unless a contraindication is documented in the medical record.  This patient is not currently on an ACEI or ARB for HF.  This note is being placed in the record in order to provide documentation that a contraindication to the use of these agents is present for this encounter.  ACE Inhibitor or Angiotensin Receptor Blocker is contraindicated (specify all that apply)  []   ACEI allergy AND ARB allergy []   Angioedema []   Moderate or severe aortic stenosis []   Hyperkalemia []   Hypotension []   Renal artery stenosis [x]   Worsening renal function, preexisting renal disease or dysfunction   Sindy Guadeloupe 2013-10-26 11:50 AM

## 2013-09-27 NOTE — Progress Notes (Signed)
[place] Progress Note Patient Name: [name] DOB: 1919-06-23 MRN: [id number]  Date of Service  09/26/2013   HPI/Events of Note   Severe agitation, combative QTc <500  eICU Interventions  Haldol IM now   Intervention Category Intermediate Interventions: Change in mental status - evaluation and management  MCQUAID, DOUGLAS 09/26/2013, 9:13 PM

## 2013-09-27 NOTE — Plan of Care (Signed)
Problem: Phase I Progression Outcomes Goal: Up in chair, BRP Outcome: Not Applicable Date Met:  47/20/72 Pt on Bedrest per MD orders

## 2013-09-27 NOTE — Progress Notes (Signed)
ANTICOAGULATION/ANTIBIOTIC CONSULT NOTE - Follow Up Consult  Pharmacy Consult for Heparin and Ciprofloxacin Indication: chest pain/ACS and possible intra-abd infection  Allergies  Allergen Reactions  . Penicillins Hives and Rash    Patient Measurements: Height: 5\' 7"  (170.2 cm) Weight: 182 lb 8.7 oz (82.8 kg) IBW/kg (Calculated) : 66.1  Vital Signs: Temp: 97.7 F (36.5 C) (02/18 0812) Temp src: Oral (02/18 0812) BP: 98/72 mmHg (02/18 1000) Pulse Rate: 84 (02/18 1000)  Labs:  Recent Labs  2013/10/04 1527  2013-10-04 1850 2013/10/04 2050 2013-10-04 2315  09/12/2013 0542 10/04/2013 1150 09/20/2013 1420 10/01/2013 2225 09/16/2013 0306 09/26/2013 1000  HGB 12.9*  --   --   --   --   --  13.5  --   --   --  12.0*  --   HCT 40.5  --   --   --   --   --  42.3  --   --   --  38.3*  --   PLT 170  --   --   --   --   --  173  --   --   --  159  --   APTT  --   --   --  34  --   --   --   --   --   --   --   --   LABPROT  --   --   --  12.1  --   --   --   --   --   --   --   --   INR  --   --   --  0.91  --   --   --   --   --   --   --   --   HEPARINUNFRC  --   --   --   --   --   < > 0.12*  --  0.24* 0.27*  --  0.30  CREATININE 1.39*  --   --   --   --   --  1.33  --   --   --  1.68*  --   TROPONINI  --   < > 15.94*  --  >20.00*  --  >20.00* 18.77*  --   --   --   --   < > = values in this interval not displayed.  Estimated Creatinine Clearance: 28.9 ml/min (by C-G formula based on Cr of 1.68).  Assessment: Anticoagulation: 80 y/o M on heparin for ACS/STEMI. No emergent cath due to delayed presentation. Hgb down a bit - will watch. No bleeding noted. Heparin level 0.3 (low end of therapeutic) on 1700 units/hr. RN noted ~0700 a.m. that heparin gtt was not running - she is unsure how long it was not running (probably <1 hr).    Infectious Disease: Cipro/Flagyl D#3 for intra-abd process. Afeb. Wbc elevated slightly.  2/16 Cipro>> 2/16 Flagyl>>  2/16 Urine>>neg 2/17 Stool>> 2/17 Cdiff  neg MRSA PCR neg   Goal of Therapy:  Heparin level 0.3-0.7 units/ml Monitor platelets by anticoagulation protocol: Yes   Plan:  - Continue heparin drip at 1700 units/hr - F/u 8 hr heparin level to confirm therapeutic - Daily heparin level and CBC - Monitor for bleeding - Change Cipro to 500mg  IV q24h - Will f/u renal function, micro data, pt's clinical condition  Sherlon Handing, PharmD, BCPS Clinical pharmacist, pager [phone] 09/10/2013,11:27 AM

## 2013-09-27 NOTE — Progress Notes (Signed)
Cardiology note from today reviewed.   Given caridology note and worsening heart failure, no role for surgery.  Agree with palliative care.   Signing off. Please call with questions Leighton Ruff. Redmond Pulling, MD, FACS General, Bariatric, & Minimally Invasive Surgery [facility], [place]

## 2013-09-27 NOTE — Progress Notes (Addendum)
Patient ID: [name], male   DOB: [birth year], 80 y.o.   MRN: [id number]  Subjective: No BM documented, pt reports having a BM this AM(poor historian, but a&ox3 this AM).  No n/v.  denies abdominal pain.  On NRB.  Sitter at bedside.   Being diuresed, BP soft, sCr up a bit.    Objective:  Vital signs:  Filed Vitals:   10/06/2013 0602 09/10/2013 0700 09/24/2013 0800 10/07/2013 0812  BP:  91/61 130/78   Pulse:  102 110   Temp:    97.7 F (36.5 C)  TempSrc:    Oral  Resp:  25 26   Height:      Weight: 182 lb 8.7 oz (82.8 kg)     SpO2:  98% 96%     Last BM Date: 09/21/2013  Intake/Output   Yesterday:  02/17 0701 - 02/18 0700 In: 2157.5 [P.O.:20; I.V.:1237.5; IV Piggyback:900] Out: 925 [Urine:925] This shift:  Total I/O In: 437 [I.V.:37; IV Piggyback:400] Out: -    Physical Exam: General: Pt awake/alert/oriented in noacute distress Abdomen: Soft.  Mildly distended.  Non tender.  No evidence of peritonitis.  No incarcerated hernias.   Problem List:   Principal Problem:   Partial small bowel obstruction Active Problems:   Hypertension   CKD (chronic kidney disease) stage 3, GFR 30-59 ml/min   Nausea and vomiting   CAD (coronary artery disease)   Elevated troponin   Acute MI, anterior wall, initial episode of care    Results:   Labs: Results for orders placed during the hospital encounter of 09/22/2013 (from the past 48 hour(s))  CBC WITH DIFFERENTIAL     Status: Abnormal   Collection Time    09/13/2013  3:27 PM      Result Value Ref Range   WBC 4.9  4.0 - 10.5 K/uL   RBC 3.84 (*) 4.22 - 5.81 MIL/uL   Hemoglobin 12.9 (*) 13.0 - 17.0 g/dL   HCT 40.5  39.0 - 52.0 %   MCV 105.5 (*) 78.0 - 100.0 fL   MCH 33.6  26.0 - 34.0 pg   MCHC 31.9  30.0 - 36.0 g/dL   RDW 15.2  11.5 - 15.5 %   Platelets 170  150 - 400 K/uL   Neutrophils Relative % 82 (*) 43 - 77 %   Neutro Abs 4.0  1.7 - 7.7 K/uL   Lymphocytes Relative 9 (*) 12 - 46 %   Lymphs Abs 0.4 (*) 0.7 - 4.0 K/uL    Monocytes Relative 9  3 - 12 %   Monocytes Absolute 0.5  0.1 - 1.0 K/uL   Eosinophils Relative 0  0 - 5 %   Eosinophils Absolute 0.0  0.0 - 0.7 K/uL   Basophils Relative 0  0 - 1 %   Basophils Absolute 0.0  0.0 - 0.1 K/uL  COMPREHENSIVE METABOLIC PANEL     Status: Abnormal   Collection Time    10/03/2013  3:27 PM      Result Value Ref Range   Sodium 142  137 - 147 mEq/L   Potassium 4.0  3.7 - 5.3 mEq/L   Chloride 99  96 - 112 mEq/L   CO2 28  19 - 32 mEq/L   Glucose, Bld 131 (*) 70 - 99 mg/dL   BUN 19  6 - 23 mg/dL   Creatinine, Ser 1.39 (*) 0.50 - 1.35 mg/dL   Calcium 9.5  8.4 - 10.5 mg/dL   Total Protein 7.1  6.0 - 8.3 g/dL   Albumin 3.4 (*) 3.5 - 5.2 g/dL   AST 86 (*) 0 - 37 U/L   ALT 16  0 - 53 U/L   Alkaline Phosphatase 101  39 - 117 U/L   Total Bilirubin 0.5  0.3 - 1.2 mg/dL   GFR calc non Af Amer 42 (*) >90 mL/min   GFR calc Af Amer 49 (*) >90 mL/min   Comment: (NOTE)     The eGFR has been calculated using the CKD EPI equation.     This calculation has not been validated in all clinical situations.     eGFR's persistently <90 mL/min signify possible Chronic Kidney     Disease.  LIPASE, BLOOD     Status: Abnormal   Collection Time    09/11/2013  3:27 PM      Result Value Ref Range   Lipase 9 (*) 11 - 59 U/L  CG4 I-STAT (LACTIC ACID)     Status: None   Collection Time    09/18/2013  3:41 PM      Result Value Ref Range   Lactic Acid, Venous 1.45  0.5 - 2.2 mmol/L  URINE CULTURE     Status: None   Collection Time    10/07/2013  3:44 PM      Result Value Ref Range   Specimen Description URINE, CLEAN CATCH     Special Requests NONE     Culture  Setup Time       Value: 09/24/2013 21:15     Performed at [facility]       Value: NO GROWTH     Performed at [facility]   Culture       Value: NO GROWTH     Performed at [facility]   Report Status 09/24/2013 FINAL    URINALYSIS, ROUTINE W REFLEX MICROSCOPIC     Status: Abnormal    Collection Time    09/22/2013  3:44 PM      Result Value Ref Range   Color, Urine AMBER (*) YELLOW   Comment: BIOCHEMICALS MAY BE AFFECTED BY COLOR   APPearance CLEAR  CLEAR   Specific Gravity, Urine 1.024  1.005 - 1.030   pH 6.0  5.0 - 8.0   Glucose, UA NEGATIVE  NEGATIVE mg/dL   Hgb urine dipstick MODERATE (*) NEGATIVE   Bilirubin Urine NEGATIVE  NEGATIVE   Ketones, ur NEGATIVE  NEGATIVE mg/dL   Protein, ur >300 (*) NEGATIVE mg/dL   Urobilinogen, UA 0.2  0.0 - 1.0 mg/dL   Nitrite NEGATIVE  NEGATIVE   Leukocytes, UA NEGATIVE  NEGATIVE  URINE MICROSCOPIC-ADD ON     Status: Abnormal   Collection Time    10/06/2013  3:44 PM      Result Value Ref Range   Squamous Epithelial / LPF RARE  RARE   RBC / HPF 3-6  <3 RBC/hpf   Casts HYALINE CASTS (*) NEGATIVE  OCCULT BLOOD, POC DEVICE     Status: Abnormal   Collection Time    09/20/2013  3:49 PM      Result Value Ref Range   Fecal Occult Bld POSITIVE (*) NEGATIVE  TROPONIN I     Status: Abnormal   Collection Time    10/04/2013  6:50 PM      Result Value Ref Range   Troponin I 15.94 (*) <0.30 ng/mL   Comment:  Due to the release kinetics of cTnI,     a negative result within the first hours     of the onset of symptoms does not rule out     myocardial infarction with certainty.     If myocardial infarction is still suspected,     repeat the test at appropriate intervals.     CRITICAL RESULT CALLED TO, READ BACK BY AND VERIFIED WITH:     ALUDWELL RN AT 2025 ON [id number] BY DLONG  POCT I-STAT TROPONIN I     Status: Abnormal   Collection Time    09/20/2013  7:03 PM      Result Value Ref Range   Troponin i, poc 12.51 (*) 0.00 - 0.08 ng/mL   Comment NOTIFIED PHYSICIAN     Comment 3            Comment: Due to the release kinetics of cTnI,     a negative result within the first hours     of the onset of symptoms does not rule out     myocardial infarction with certainty.     If myocardial infarction is still suspected,     repeat  the test at appropriate intervals.  HEMOGLOBIN A1C     Status: None   Collection Time    09/11/2013  7:32 PM      Result Value Ref Range   Hemoglobin A1C 5.1  <5.7 %   Comment: (NOTE)                                                                               According to the ADA Clinical Practice Recommendations for 2011, when     HbA1c is used as a screening test:      >=6.5%   Diagnostic of Diabetes Mellitus               (if abnormal result is confirmed)     5.7-6.4%   Increased risk of developing Diabetes Mellitus     References:Diagnosis and Classification of Diabetes Mellitus,Diabetes     VOJJ,0093,81(WEXHB 1):S62-S69 and Standards of Medical Care in             Diabetes - 2011,Diabetes Care,2011,34 (Suppl 1):S11-S61.   Mean Plasma Glucose 100  <117 mg/dL   Comment: Performed at [facility]     Status: None   Collection Time    09/11/2013  7:33 PM      Result Value Ref Range   Cholesterol 196  0 - 200 mg/dL   Triglycerides 95  <150 mg/dL   HDL 84  >39 mg/dL   Total CHOL/HDL Ratio 2.3     VLDL 19  0 - 40 mg/dL   LDL Cholesterol 93  0 - 99 mg/dL   Comment:            Total Cholesterol/HDL:CHD Risk     Coronary Heart Disease Risk Table                         Men   Women      1/2 Average Risk   3.4   3.3  Average Risk       5.0   4.4      2 X Average Risk   9.6   7.1      3 X Average Risk  23.4   11.0                Use the calculated Patient Ratio     above and the CHD Risk Table     to determine the patient's CHD Risk.                ATP III CLASSIFICATION (LDL):      <100     mg/dL   Optimal      100-129  mg/dL   Near or Above                        Optimal      130-159  mg/dL   Borderline      160-189  mg/dL   High      >190     mg/dL   Very High     Performed at [facility]     Status: None   Collection Time    09/30/2013  8:50 PM      Result Value Ref Range   Prothrombin Time 12.1  11.6 - 15.2 seconds   INR  0.91  0.00 - 1.49  APTT     Status: None   Collection Time    09/10/2013  8:50 PM      Result Value Ref Range   aPTT 34  24 - 37 seconds  MRSA PCR SCREENING     Status: None   Collection Time    10/07/2013 11:03 PM      Result Value Ref Range   MRSA by PCR NEGATIVE  NEGATIVE   Comment:            The GeneXpert MRSA Assay (FDA     approved for NASAL specimens     only), is one component of a     comprehensive MRSA colonization     surveillance program. It is not     intended to diagnose MRSA     infection nor to guide or     monitor treatment for     MRSA infections.  TROPONIN I     Status: Abnormal   Collection Time    09/21/2013 11:15 PM      Result Value Ref Range   Troponin I >20.00 (*) <0.30 ng/mL   Comment:            Due to the release kinetics of cTnI,     a negative result within the first hours     of the onset of symptoms does not rule out     myocardial infarction with certainty.     If myocardial infarction is still suspected,     repeat the test at appropriate intervals.     CRITICAL RESULT CALLED TO, READ BACK BY AND VERIFIED WITH:     PARRISH,J RN 09/19/2013 0002 JORDANS  CLOSTRIDIUM DIFFICILE BY PCR     Status: None   Collection Time    09/21/2013  1:23 AM      Result Value Ref Range   C difficile by pcr NEGATIVE  NEGATIVE  GI PATHOGEN PANEL BY PCR, STOOL     Status: None   Collection Time    10/05/2013  1:23 AM  Result Value Ref Range   Campylobacter by PCR Negative     C difficile toxin A/B Negative     E coli 0157 by PCR Negative     E coli (ETEC) LT/ST Negative     E coli (STEC) Negative     Salmonella by PCR Negative     Shigella by PCR Negative     Norovirus G!/G2 Positive     Rotavirus A by PCR Negative     G lamblia by PCR Negative     Cryptosporidium by PCR Negative     Comment: (NOTE)      ** Normal Reference Range for each Analyte: Not Detected **           The detection and identification of specific gastrointestinal     microbial  nucleic acid from individuals exhibiting signs and     symptoms of gastrointestinal infection aids in the diagnosis     of gastrointestinal infection when used in conjunction with     clinical evaluation, laboratory findings, and epidemiological     information.     ** The xTAG Gastrointestinal Pathogen Panel results are presumptive     and must be confirmed by FDA-cleared tests or other acceptable     reference methods.  The results of this test should not be used as     the sole basis for diagnosis, treatment, or other patient management     decisions.     Performed using the [employer] xTAG Gastrointestinal Pathogen Panel test     kit.     Performed at [facility]     Status: Abnormal   Collection Time    09/17/2013  5:42 AM      Result Value Ref Range   Sodium 140  137 - 147 mEq/L   Potassium 3.9  3.7 - 5.3 mEq/L   Chloride 98  96 - 112 mEq/L   CO2 25  19 - 32 mEq/L   Glucose, Bld 130 (*) 70 - 99 mg/dL   BUN 17  6 - 23 mg/dL   Creatinine, Ser 1.33  0.50 - 1.35 mg/dL   Calcium 9.2  8.4 - 10.5 mg/dL   GFR calc non Af Amer 45 (*) >90 mL/min   GFR calc Af Amer 52 (*) >90 mL/min   Comment: (NOTE)     The eGFR has been calculated using the CKD EPI equation.     This calculation has not been validated in all clinical situations.     eGFR's persistently <90 mL/min signify possible Chronic Kidney     Disease.  TROPONIN I     Status: Abnormal   Collection Time    09/10/2013  5:42 AM      Result Value Ref Range   Troponin I >20.00 (*) <0.30 ng/mL   Comment:            Due to the release kinetics of cTnI,     a negative result within the first hours     of the onset of symptoms does not rule out     myocardial infarction with certainty.     If myocardial infarction is still suspected,     repeat the test at appropriate intervals.     CRITICAL VALUE NOTED.  VALUE IS CONSISTENT WITH PREVIOUSLY REPORTED AND CALLED VALUE.  CBC WITH DIFFERENTIAL     Status:  Abnormal   Collection Time    09/24/2013  5:42 AM  Result Value Ref Range   WBC 8.1  4.0 - 10.5 K/uL   RBC 3.94 (*) 4.22 - 5.81 MIL/uL   Hemoglobin 13.5  13.0 - 17.0 g/dL   HCT 42.3  39.0 - 52.0 %   MCV 107.4 (*) 78.0 - 100.0 fL   MCH 34.3 (*) 26.0 - 34.0 pg   MCHC 31.9  30.0 - 36.0 g/dL   RDW 15.3  11.5 - 15.5 %   Platelets 173  150 - 400 K/uL   Neutrophils Relative % 78 (*) 43 - 77 %   Neutro Abs 6.3  1.7 - 7.7 K/uL   Lymphocytes Relative 10 (*) 12 - 46 %   Lymphs Abs 0.8  0.7 - 4.0 K/uL   Monocytes Relative 12  3 - 12 %   Monocytes Absolute 0.9  0.1 - 1.0 K/uL   Eosinophils Relative 1  0 - 5 %   Eosinophils Absolute 0.0  0.0 - 0.7 K/uL   Basophils Relative 0  0 - 1 %   Basophils Absolute 0.0  0.0 - 0.1 K/uL  PRO B NATRIURETIC PEPTIDE     Status: Abnormal   Collection Time    09/18/2013  5:42 AM      Result Value Ref Range   Pro B Natriuretic peptide (BNP) 30802.0 (*) 0 - 450 pg/mL  HEPARIN LEVEL (UNFRACTIONATED)     Status: Abnormal   Collection Time    09/23/2013  5:42 AM      Result Value Ref Range   Heparin Unfractionated 0.12 (*) 0.30 - 0.70 IU/mL   Comment:            IF HEPARIN RESULTS ARE BELOW     EXPECTED VALUES, AND PATIENT     DOSAGE HAS BEEN CONFIRMED,     SUGGEST FOLLOW UP TESTING     OF ANTITHROMBIN III LEVELS.  LIPID PANEL     Status: None   Collection Time    10/05/2013  5:42 AM      Result Value Ref Range   Cholesterol 198  0 - 200 mg/dL   Triglycerides 94  <150 mg/dL   HDL 89  >39 mg/dL   Total CHOL/HDL Ratio 2.2     VLDL 19  0 - 40 mg/dL   LDL Cholesterol 90  0 - 99 mg/dL   Comment:            Total Cholesterol/HDL:CHD Risk     Coronary Heart Disease Risk Table                         Men   Women      1/2 Average Risk   3.4   3.3      Average Risk       5.0   4.4      2 X Average Risk   9.6   7.1      3 X Average Risk  23.4   11.0                Use the calculated Patient Ratio     above and the CHD Risk Table     to determine the  patient's CHD Risk.                ATP III CLASSIFICATION (LDL):      <100     mg/dL   Optimal      100-129  mg/dL   Near or  Above                        Optimal      130-159  mg/dL   Borderline      160-189  mg/dL   High      >190     mg/dL   Very High  TROPONIN I     Status: Abnormal   Collection Time    09/12/2013 11:50 AM      Result Value Ref Range   Troponin I 18.77 (*) <0.30 ng/mL   Comment:            Due to the release kinetics of cTnI,     a negative result within the first hours     of the onset of symptoms does not rule out     myocardial infarction with certainty.     If myocardial infarction is still suspected,     repeat the test at appropriate intervals.     CRITICAL VALUE NOTED.  VALUE IS CONSISTENT WITH PREVIOUSLY REPORTED AND CALLED VALUE.  HEPARIN LEVEL (UNFRACTIONATED)     Status: Abnormal   Collection Time    09/15/2013  2:20 PM      Result Value Ref Range   Heparin Unfractionated 0.24 (*) 0.30 - 0.70 IU/mL   Comment:            IF HEPARIN RESULTS ARE BELOW     EXPECTED VALUES, AND PATIENT     DOSAGE HAS BEEN CONFIRMED,     SUGGEST FOLLOW UP TESTING     OF ANTITHROMBIN III LEVELS.  HEPARIN LEVEL (UNFRACTIONATED)     Status: Abnormal   Collection Time    09/26/2013 10:25 PM      Result Value Ref Range   Heparin Unfractionated 0.27 (*) 0.30 - 0.70 IU/mL   Comment:            IF HEPARIN RESULTS ARE BELOW     EXPECTED VALUES, AND PATIENT     DOSAGE HAS BEEN CONFIRMED,     SUGGEST FOLLOW UP TESTING     OF ANTITHROMBIN III LEVELS.  CBC WITH DIFFERENTIAL     Status: Abnormal   Collection Time    09/14/2013  3:06 AM      Result Value Ref Range   WBC 10.7 (*) 4.0 - 10.5 K/uL   RBC 3.56 (*) 4.22 - 5.81 MIL/uL   Hemoglobin 12.0 (*) 13.0 - 17.0 g/dL   HCT 38.3 (*) 39.0 - 52.0 %   MCV 107.6 (*) 78.0 - 100.0 fL   MCH 33.7  26.0 - 34.0 pg   MCHC 31.3  30.0 - 36.0 g/dL   RDW 15.1  11.5 - 15.5 %   Platelets 159  150 - 400 K/uL   Neutrophils Relative % 72  43 - 77  %   Neutro Abs 7.8 (*) 1.7 - 7.7 K/uL   Lymphocytes Relative 14  12 - 46 %   Lymphs Abs 1.5  0.7 - 4.0 K/uL   Monocytes Relative 13 (*) 3 - 12 %   Monocytes Absolute 1.4 (*) 0.1 - 1.0 K/uL   Eosinophils Relative 1  0 - 5 %   Eosinophils Absolute 0.1  0.0 - 0.7 K/uL   Basophils Relative 0  0 - 1 %   Basophils Absolute 0.0  0.0 - 0.1 K/uL  BASIC METABOLIC PANEL     Status: Abnormal   Collection Time    09/10/2013  3:06 AM      Result Value Ref Range   Sodium 137  137 - 147 mEq/L   Potassium 3.8  3.7 - 5.3 mEq/L   Chloride 98  96 - 112 mEq/L   CO2 28  19 - 32 mEq/L   Glucose, Bld 100 (*) 70 - 99 mg/dL   BUN 22  6 - 23 mg/dL   Creatinine, Ser 1.68 (*) 0.50 - 1.35 mg/dL   Calcium 8.8  8.4 - 10.5 mg/dL   GFR calc non Af Amer 34 (*) >90 mL/min   GFR calc Af Amer 39 (*) >90 mL/min   Comment: (NOTE)     The eGFR has been calculated using the CKD EPI equation.     This calculation has not been validated in all clinical situations.     eGFR's persistently <90 mL/min signify possible Chronic Kidney     Disease.    Imaging / Studies: Ct Abdomen Pelvis W Contrast  09/18/2013   CLINICAL DATA:  Abdominal pain, vomiting, constipation  EXAM: CT ABDOMEN AND PELVIS WITH CONTRAST  TECHNIQUE: Multidetector CT imaging of the abdomen and pelvis was performed using the standard protocol following bolus administration of intravenous contrast.  CONTRAST:  25m OMNIPAQUE IOHEXOL 300 MG/ML SOLN, 848mOMNIPAQUE IOHEXOL 300 MG/ML SOLN  COMPARISON:  02/05/2010  FINDINGS: Osteopenia and degenerative changes thoracolumbar spine. Extensive atherosclerotic calcifications of abdominal aorta, bilateral renal artery and bilateral iliac arteries are noted.  Lung bases shows bilateral probable chronic interstitial prominence and mild fibrotic changes.  Liver, pancreas, spleen and adrenals are unremarkable. Small layering gallstones are noted within gallbladder. No aortic aneurysm. Kidneys are symmetrical in enhancement. There  is mild cortical thinning on the left side probable due to mild asymmetric atrophy. Bilateral small renal cysts are noted.  Delayed renal images shows bilateral renal symmetrical excretion. Bilateral visualized proximal ureter is unremarkable. There is no small bowel obstruction.  There is abnormal appearance of the cecum and ileocecal valve. There is polypoid prominence of the ileocecal valve and in axial image 51 There is target like appearance of the cecum. This is best seen in coronal image 44. Findings are highly suspicious for intussusception of the terminal ileum into the cecum. There is mild surrounding mesenteric edema and congested mesenteric vessels. In coronal image 39 there are tiny air bubbles in adjacent mesentery findings may be due to ischemic changes less likely perforation or may represent intraluminal air trapped within intussusception. There is at least partial obstruction at this level.  The urinary bladder is unremarkable. Tiny prostate gland calcifications. No pelvic ascites or free air. No free abdominal air. No destructive bony lesions are noted within pelvis.  IMPRESSION: 1. There is abnormal appearance of the cecum and ileocecal valve. There is polypoid prominence of the ileocecal valve in axial image 51 There is target like appearance of the cecum. This is best seen in coronal image 44. Findings are highly suspicious for intussusception of the terminal ileum into the cecum. There is mild surrounding mesenteric edema and congested mesenteric vessels. In coronal image 39 there are tiny air bubbles in adjacent mesentery findings may be due to ischemic changes less likely perforation or may represent intraluminal air trapped within intussusception. There is at least partial obstruction at this level. A neoplastic process at ileocecal valve level cannot be entirely excluded. 2. No hydronephrosis or hydroureter. 3. No free abdominal air. 4. Mild a left renal atrophy.  These results were called  by telephone at the time of  interpretation on 09/12/2013 at 6:48 PM to Dr. Jeannett Senior , who verbally acknowledged these results.   Electronically Signed   By: Lahoma Crocker M.D.   On: 10/01/2013 18:48   Dg Abd Acute W/chest  09/23/2013   CLINICAL DATA:  Abdominal pain.  Nausea.  Shortness of breath.  EXAM: ACUTE ABDOMEN SERIES (ABDOMEN 2 VIEW & CHEST 1 VIEW)  COMPARISON:  DG CHEST 2 VIEW dated 06/24/2013; DG CHEST 2 VIEW dated 05/01/2013; DG CHEST 2 VIEW dated 02/28/2013; DG CHEST 2 VIEW dated 02/20/2013; CT ABD/PELVIS W CM dated 02/05/2010  FINDINGS: Gaseous distention of multiple loops of small bowel throughout the abdomen, demonstrating air-fluid levels on the erect image. No evidence of free intraperitoneal air. Gas and liquid stool throughout normal caliber colon. Left renal stent. Numerous pelvic phleboliths. Calcification projected to the left of the L2-3 level is too lateral to represent a urinary tract calculus. No definite opaque urinary tract calculi. Left renal artery stent. Degenerative changes involving the thoracic and lumbar spine.  Cardiac silhouette mildly to moderately enlarged but stable. Diffuse interstitial pulmonary fibrosis and low lung volumes, unchanged. No new pulmonary parenchymal abnormalities.  IMPRESSION: 1. Partial small bowel obstruction.  No free intraperitoneal air. 2. Stable cardiomegaly. Stable interstitial pulmonary fibrosis with associated low lung volumes. No acute cardiopulmonary disease.   Electronically Signed   By: Evangeline Dakin M.D.   On: 09/11/2013 16:21   Dg Abd Portable 2v  10/04/2013   CLINICAL DATA:  Small-bowel obstruction  EXAM: PORTABLE ABDOMEN - 2 VIEW  COMPARISON:  CT from the previous day  FINDINGS: Scattered large and small bowel gas is noted. No obstructive changes are seen. No free air is noted. Chronic changes are noted in the bases bilaterally. Degenerative changes of the lumbar spine are again seen. A left renal stent is again noted.  IMPRESSION:  No definitive obstructive change.   Electronically Signed   By: Inez Catalina M.D.   On: 09/14/2013 13:00    Medications / Allergies: per chart  Antibiotics: Anti-infectives   Start     Dose/Rate Route Frequency Ordered Stop   09/24/2013 2000  ciprofloxacin (CIPRO) IVPB 400 mg     400 mg 200 mL/hr over 60 Minutes Intravenous Every 12 hours 09/24/2013 1931     10/01/2013 1930  metroNIDAZOLE (FLAGYL) IVPB 500 mg     500 mg 100 mL/hr over 60 Minutes Intravenous Every 8 hours 09/30/2013 1928        Assessment/Plan Abdominal pain, distension  Abnormal cecum and ileum - concerning for pSBO secondary to intussusception or possible ischemic colitis on CT  Nausea/vomiting  Acute anterior wall ST segment elevation MI  Heart failure H/o HTN, GERD, COPD, CKD stage 3 with RAS, gout, vertigo May have clears pending time cardiac cath Consider discussing goals of care with family and patient given poor prognonis.  Should he survive and is stable enough, he will need a GI consultation on non urgent basis to discuss the possibility of a colonoscopy to rule out a mass.   Surgery will continue to follow   ADDENDUM: Cardiology's note reviewed.  Patient has worsened and is not even a cath candidate.  He would likely not survive an operation .  Further workup for intra-abdominal process will be deferred at this time due to his poor prognosis.  We will sign off.  Please call us back if anything changes. Henreitta Cea 3:27 PM   Erby Pian, [facility] Pager [phone] Office [phone]  09/24/2013  8:28 AM

## 2013-09-27 NOTE — Progress Notes (Signed)
ANTICOAGULATION CONSULT NOTE - Follow Up Consult  Pharmacy Consult for Heparin Indication: chest pain/ACS   Allergies  Allergen Reactions  . Penicillins Hives and Rash  Patient Measurements: Height: 5\' 7"  (170.2 cm) Weight: 182 lb 8.7 oz (82.8 kg) IBW/kg (Calculated) : 66.1 Heparin dosing weight - 82.6 kg Vital Signs: Temp: 96.5 F (35.8 C) (02/18 1608) Temp src: Axillary (02/18 1608) BP: 92/56 mmHg (02/18 1830) Pulse Rate: 99 (02/18 1830) Labs:  Recent Labs  10/04/2013 1527  09/21/2013 1850 10/03/2013 2050 09/21/2013 2315 10-21-2013 0542 2013-10-21 1150  October 21, 2013 2225 10/04/2013 0306 09/16/2013 1000 09/22/2013 1700  HGB 12.9*  --   --   --   --  13.5  --   --   --  12.0*  --   --   HCT 40.5  --   --   --   --  42.3  --   --   --  38.3*  --   --   PLT 170  --   --   --   --  173  --   --   --  159  --   --   APTT  --   --   --  34  --   --   --   --   --   --   --   --   LABPROT  --   --   --  12.1  --   --   --   --   --   --   --   --   INR  --   --   --  0.91  --   --   --   --   --   --   --   --   HEPARINUNFRC  --   --   --   --   --  0.12*  --   < > 0.27*  --  0.30 0.27*  CREATININE 1.39*  --   --   --   --  1.33  --   --   --  1.68*  --   --   TROPONINI  --   < > 15.94*  --  >20.00* >20.00* 18.77*  --   --   --   --   --   < > = values in this interval not displayed.  Estimated Creatinine Clearance: 28.9 ml/min (by C-G formula based on Cr of 1.68).  Assessment: 80 y/o M on heparin for ACS/STEMI. No emergent cath due to delayed presentation. Hgb down a bit - will watch. No bleeding noted. Heparin level 0.27 (sub therapeutic) on 1700 units/hr. This was a repeat level as RN noted ~0700 a.m. that heparin gtt was not running - she is unsure how long it was not running (probably <1 hr).  As heparin level remains slightly low- will increase rate to obtain goal level. Per RN this evening there have been no issues with the infusion.   Goal of Therapy:  Heparin level 0.3-0.7  units/ml Monitor platelets by anticoagulation protocol: Yes   Plan:  - Increase heparin drip at 1800 units/hr - F/u AM level to confirm - Daily heparin level and CBC - Monitor for bleeding  Sloan Leiter, PharmD, BCPS Clinical Pharmacist [phone]  ,7:06 PM

## 2013-09-30 LAB — STOOL CULTURE: Special Requests: NORMAL

## 2013-10-02 ENCOUNTER — Telehealth: Payer: Self-pay

## 2013-10-02 NOTE — Telephone Encounter (Signed)
Original D/C received From [place], it was Sent here For Dr.Jordan To Sign He is not the Correct Doctor to Sign, I called [facility] spoke with Lynn/Medical Records who will  Speak With [place] and let me know Status.

## 2013-10-02 NOTE — Telephone Encounter (Signed)
[facility] called back [place] Dr.Croitoru assist could not Get a [employer], he is Unavailable.

## 2013-10-04 ENCOUNTER — Telehealth: Payer: Self-pay | Admitting: Cardiology

## 2013-10-04 NOTE — Telephone Encounter (Signed)
Spoke W/ Pat/[name] this am Let Her Know Dr.Hochrein has Agreed to Sign the D/C, he was rounding In [place] and then In Administration this afternoon and Back In office On Monday, [facility] stated it was [facility] to Hold D/C Until  Dr.Hochrein is back in Office to Sign on Monday.

## 2013-10-08 DEATH — deceased

## 2013-10-09 ENCOUNTER — Telehealth: Payer: Self-pay | Admitting: Cardiology

## 2013-10-09 NOTE — Telephone Encounter (Signed)
Dr.Hochrein Signed D/C, Delorise Jackson W/ Richrd Humbles aware Ready For Pick up

## 2013-10-10 ENCOUNTER — Telehealth: Payer: Self-pay | Admitting: Cardiology

## 2013-10-10 NOTE — Telephone Encounter (Signed)
D/C picked Up

## 2013-10-11 NOTE — Discharge Summary (Addendum)
Death Summary   Foster ID: [name] MRN: [id number], DOB/AGE: 1918-12-18 80 y.o. Admit date: Oct 23, 2013 D/C date:     10/11/2013  Primary Cardiologist: [name]  Primary Final Diagnoses:  1. Acute anterior wall MI / coronary artery disease 2. Bradycardia 3. Partial small bowel obstruction secondary to intussusception, possible ischemic colitis 4. Acute systolic CHF due to ischemic cardiomyopathy EF 20-25% with pulmonary edema 5. Possible cardiogenic shock 6. HTN 7. COPD  8. Acute on CKD stage III 9. Renal artery stenosis s/p angioplasty 2005 10. Left lung surgery for benign mass 2002 11. Macrocytic anemia 12. Norovirus 13. Chronic interstitial lung disease (on home O2 PRN prior to admission)  Secondary Diagnoses:  1. H/o gout 2. Left lung surgery for benign mass 2002 3. Vertigo 4. Lymphoma of lip   Hospital Course:[name] is a 80 y/o M with history of CAD, status post bare-metal stent to Lee right coronary artery in 2000, hypertension, COPD requiring oxygen supplementation as needed, stage III chronic kidney disease and renal artery stenosis status post angioplasty 2005, previous left lung surgery for a benign mass ([name], 2002), hypertension and gout. He was seen in [facility] ER 09/24/13  with severe constipation and presumed fecal impaction. He did not describe chest discomfort at that time and an electrocardiogram was not performed. He returned to [facility] ER on 2/16 with complaints of nausea, vomiting, and diarrhea. He had evidence for small bowel obstruction and there was suspicion for an intra-abdominal mass on CT, causing cecal intussusception. He also described chest pain and EKG showed evidence of a recent anterior wall myocardial infarction with Q waves already well-developed across Lee anteroseptal leads. There is only mild residual ST segment elevation in leads V2 through V4. Lee cardiac troponin I was elevated at approximately 16. He denied dyspnea but did endorse pleuritic  component to chest discomfort. Lee timing of infarct was uncertain, at least 6 hours prior to presentation. He was placed on medical therapy for Lee MI including IV heparin, aspirin, and beta blocker. He was also seen by IM for partial small bowel obstruction secondary to intussusception and possible ischemic colitis. He was transferred to [facility] for further cardiac care. Subsequent troponins were >20. 2D echo showed EF 20-25% with multiple WMA as below, mild MR, PA pressure . He was not placed on ACEI due to worsening renal insufficiency. Surgery was consulted to weigh in on Lee abdominal issues who recommended conservative approach as he did not have acute abdomen at that time. Gastroenteritis panel was positive for norovirus and hemoccult was positive. Eventual GI consultation was recommended to determine if there was any contribution from possible mass. However, [name]'s clinical condition continued to deteriorate. He developed pulmonary edema and hypotension/reduced CO - Killip class IV possibly indicative of cardiogenic shock. He was felt to have completed extensive anterior, septal and lateral MI. He was not felt to be appropriate for cardiac cath. [name] discussed Lee poor prognosis with [name] and [name]. A more palliative approach was suggested. [name] was made DNR. On Lee evening of 2/18, [name] became severely agitated. He subsequently became bradycardic and stopped breathing. This progressed to agonal respirations. He was pronounced dead at 9:27pm. Family was contacted.  Discharge Vitals: N/A  Labs: Lab Results  Component Value Date   WBC 10.7* 09/13/2013   HGB 12.0* 09/24/2013   HCT 38.3* 09/14/2013   MCV 107.6* 09/18/2013   PLT 159 09/10/2013     Lab Results  Component Value Date   CHOL 198 09/30/2013   HDL 89 09/19/2013   LDLCALC 90 10/03/2013   TRIG 94 10/01/2013    Diagnostic Studies/Procedures   Ct Abdomen Pelvis W Contrast  10/05/2013   CLINICAL  DATA:  Abdominal pain, vomiting, constipation  EXAM: CT ABDOMEN AND PELVIS WITH CONTRAST  TECHNIQUE: Multidetector CT imaging of Lee abdomen and pelvis was performed using Lee standard protocol following bolus administration of intravenous contrast.  CONTRAST:  7mL OMNIPAQUE IOHEXOL 300 MG/ML SOLN, 101mL OMNIPAQUE IOHEXOL 300 MG/ML SOLN  COMPARISON:  02/05/2010  FINDINGS: Osteopenia and degenerative changes thoracolumbar spine. Extensive atherosclerotic calcifications of abdominal aorta, bilateral renal artery and bilateral iliac arteries are noted.  Lung bases shows bilateral probable chronic interstitial prominence and mild fibrotic changes.  Liver, pancreas, spleen and adrenals are unremarkable. Small layering gallstones are noted within gallbladder. No aortic aneurysm. Kidneys are symmetrical in enhancement. There is mild cortical thinning on Lee left side probable due to mild asymmetric atrophy. Bilateral small renal cysts are noted.  Delayed renal images shows bilateral renal symmetrical excretion. Bilateral visualized proximal ureter is unremarkable. There is no small bowel obstruction.  There is abnormal appearance of Lee cecum and ileocecal valve. There is polypoid prominence of Lee ileocecal valve and in axial image 51 There is target like appearance of Lee cecum. This is best seen in coronal image 44. Findings are highly suspicious for intussusception of Lee terminal ileum into Lee cecum. There is mild surrounding mesenteric edema and congested mesenteric vessels. In coronal image 39 there are tiny air bubbles in adjacent mesentery findings may be due to ischemic changes less likely perforation or may represent intraluminal air trapped within intussusception. There is at least partial obstruction at this level.  Lee urinary bladder is unremarkable. Tiny prostate gland calcifications. No pelvic ascites or free air. No free abdominal air. No destructive bony lesions are noted within pelvis.  IMPRESSION: 1.  There is abnormal appearance of Lee cecum and ileocecal valve. There is polypoid prominence of Lee ileocecal valve in axial image 51 There is target like appearance of Lee cecum. This is best seen in coronal image 44. Findings are highly suspicious for intussusception of Lee terminal ileum into Lee cecum. There is mild surrounding mesenteric edema and congested mesenteric vessels. In coronal image 39 there are tiny air bubbles in adjacent mesentery findings may be due to ischemic changes less likely perforation or may represent intraluminal air trapped within intussusception. There is at least partial obstruction at this level. A neoplastic process at ileocecal valve level cannot be entirely excluded. 2. No hydronephrosis or hydroureter. 3. No free abdominal air. 4. Mild a left renal atrophy.  These results were called by telephone at Lee time of interpretation on 10/02/2013 at 6:48 PM to Dr. Jeannett Senior , who verbally acknowledged these results.   Electronically Signed   By: Lahoma Crocker M.D.   On: 09/11/2013 18:48   Dg Abd Acute W/chest  09/16/2013   CLINICAL DATA:  Abdominal pain.  Nausea.  Shortness of breath.  EXAM: ACUTE ABDOMEN SERIES (ABDOMEN 2 VIEW & CHEST 1 VIEW)  COMPARISON:  DG CHEST 2 VIEW dated 06/24/2013; DG CHEST 2 VIEW dated 05/01/2013; DG CHEST 2 VIEW dated 02/28/2013; DG CHEST 2 VIEW dated 02/20/2013; CT ABD/PELVIS W CM dated 02/05/2010  FINDINGS: Gaseous distention of multiple loops of small bowel throughout Lee abdomen, demonstrating air-fluid levels on Lee erect image. No evidence of free intraperitoneal air. Gas and liquid stool throughout normal caliber  colon. Left renal stent. Numerous pelvic phleboliths. Calcification projected to Lee left of Lee L2-3 level is too lateral to represent a urinary tract calculus. No definite opaque urinary tract calculi. Left renal artery stent. Degenerative changes involving Lee thoracic and lumbar spine.  Cardiac silhouette mildly to moderately enlarged  but stable. Diffuse interstitial pulmonary fibrosis and low lung volumes, unchanged. No new pulmonary parenchymal abnormalities.  IMPRESSION: 1. Partial small bowel obstruction.  No free intraperitoneal air. 2. Stable cardiomegaly. Stable interstitial pulmonary fibrosis with associated low lung volumes. No acute cardiopulmonary disease.   Electronically Signed   By: Evangeline Dakin M.D.   On: 09/30/2013 16:21   Dg Abd Portable 2v  10/07/2013   CLINICAL DATA:  Small-bowel obstruction  EXAM: PORTABLE ABDOMEN - 2 VIEW  COMPARISON:  CT from Lee previous day  FINDINGS: Scattered large and small bowel gas is noted. No obstructive changes are seen. No free air is noted. Chronic changes are noted in Lee bases bilaterally. Degenerative changes of Lee lumbar spine are again seen. A left renal stent is again noted.  IMPRESSION: No definitive obstructive change.   Electronically Signed   By: Inez Catalina M.D.   On: 10/02/2013 13:00   2D Echo 09/22/2013 - Left ventricle: Lee cavity size was normal. Systolic function was severely reduced. Lee estimated ejection fraction was in Lee range of 20% to 25%. There is akinesis of Lee entireanteroseptal myocardium. There is akinesis of Lee entireinferoseptal myocardium. There is akinesis of Lee entireapical myocardium. There is akinesis of Lee entireanterior myocardium. There is severe hypokinesis of Lee entireanterolateral myocardium. - Mitral valve: Mild regurgitation. - Right ventricle: Lee cavity size was mildly dilated. Wall thickness was normal. Systolic function was mildly to moderately reduced. - Pulmonary arteries: PA peak pressure: 93mm Hg (S). - Pericardium, extracardiac: A trivial, free-flowing pericardial effusion was identified along Lee right ventricular free wall. There was no evidence of hemodynamic compromise.    Discharge Medications  N/A  Disposition   [name] passed away on 2013-10-02.    Duration of Discharge Encounter: Greater than  30 minutes including physician and PA time.  Signed, Melina Copa PA-C 10/11/2013, 2:46 PM  This addendum is to add Chronic intersitial lung disease to Foster's list of discharge diagnoses. Neyla Gauntt PA-C

## 2013-12-19 ENCOUNTER — Ambulatory Visit: Payer: Medicare Other | Admitting: Cardiology

## 2014-09-14 IMAGING — CR DG CHEST 2V
2 series · 2 of 2 positions shown · non-contrast
Comparison: Two-view chest 02/18/2013 and 01/05/2013.

CLINICAL DATA: Shortness of breath.  Infiltrates.

CHEST - 2 VIEW

[w chest pa]
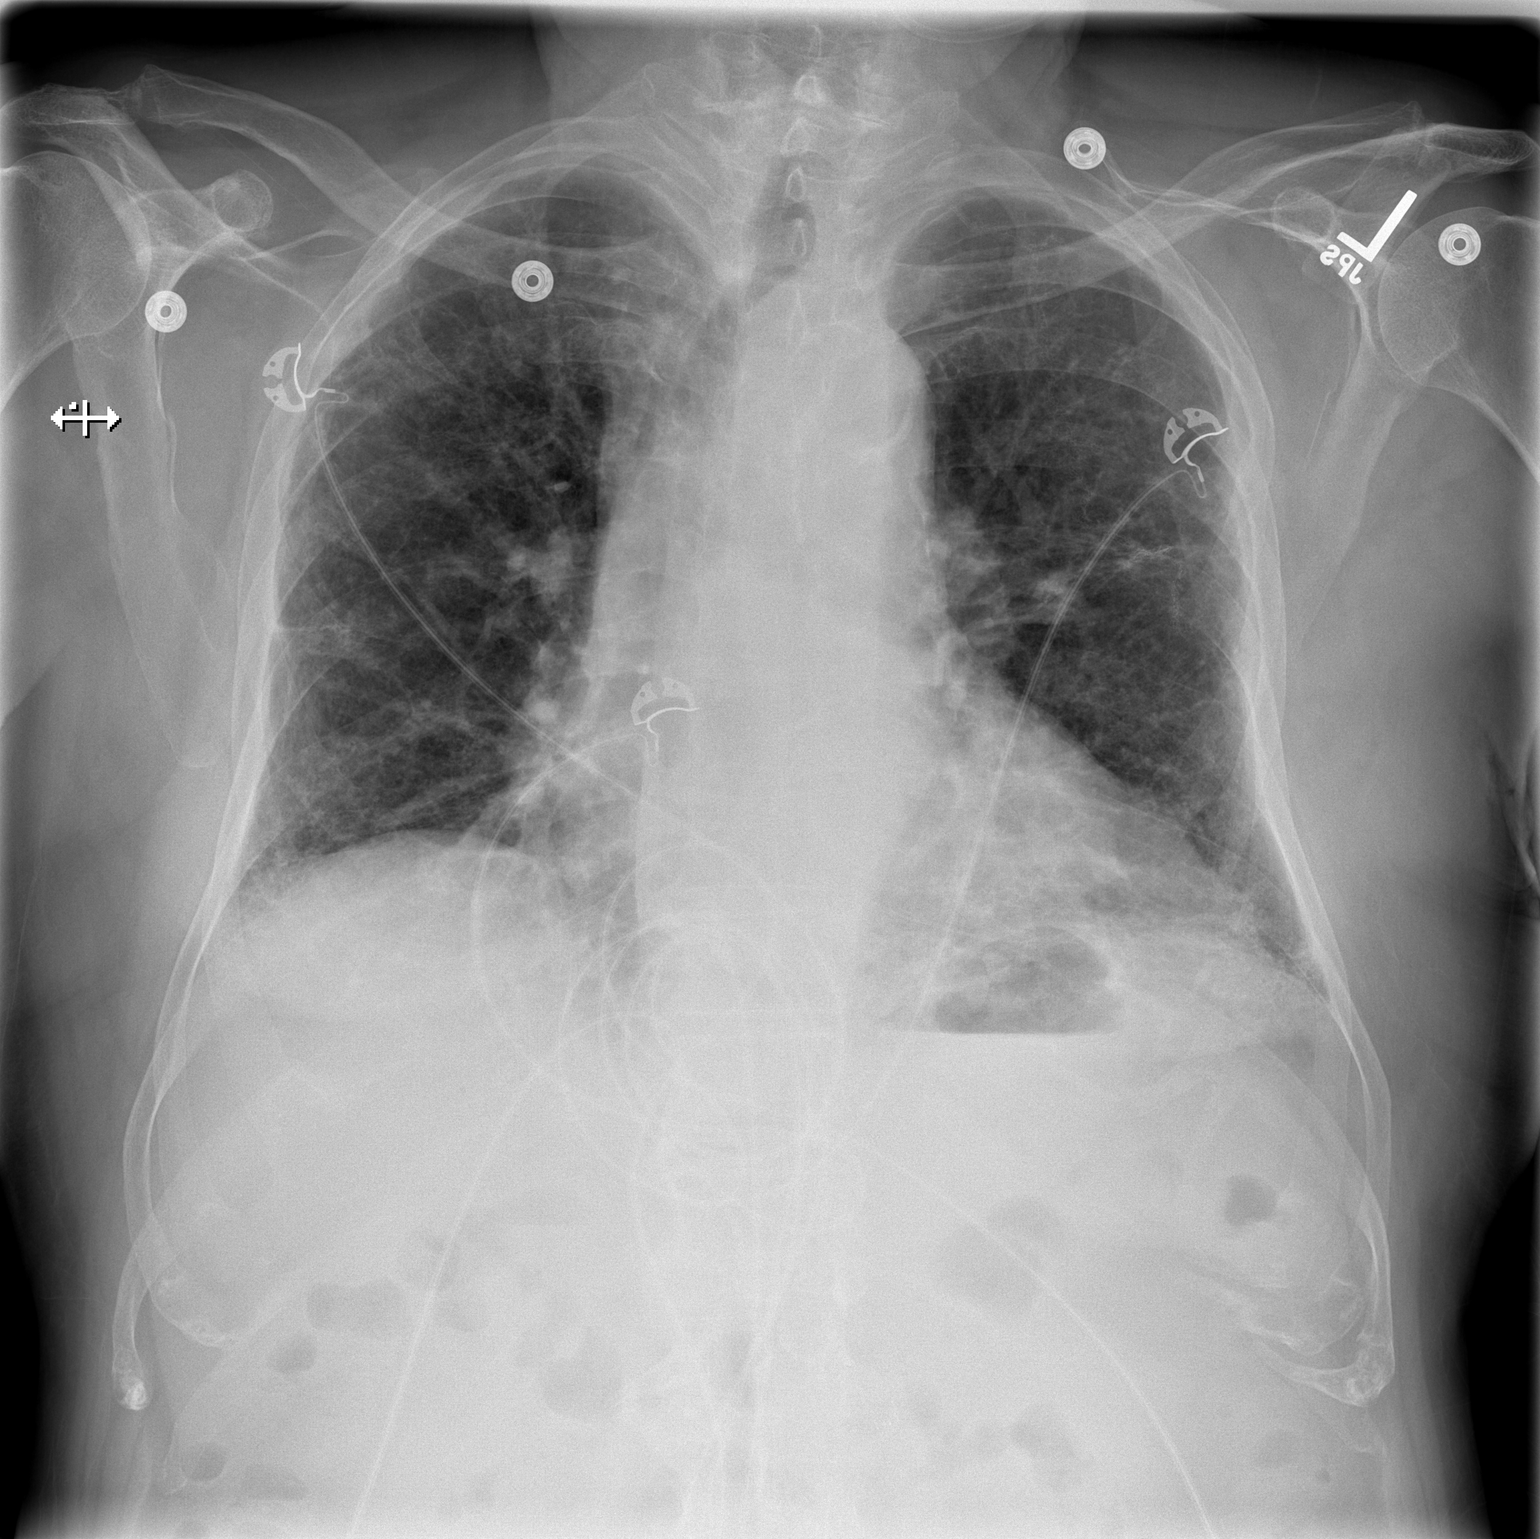

[w chest lat]
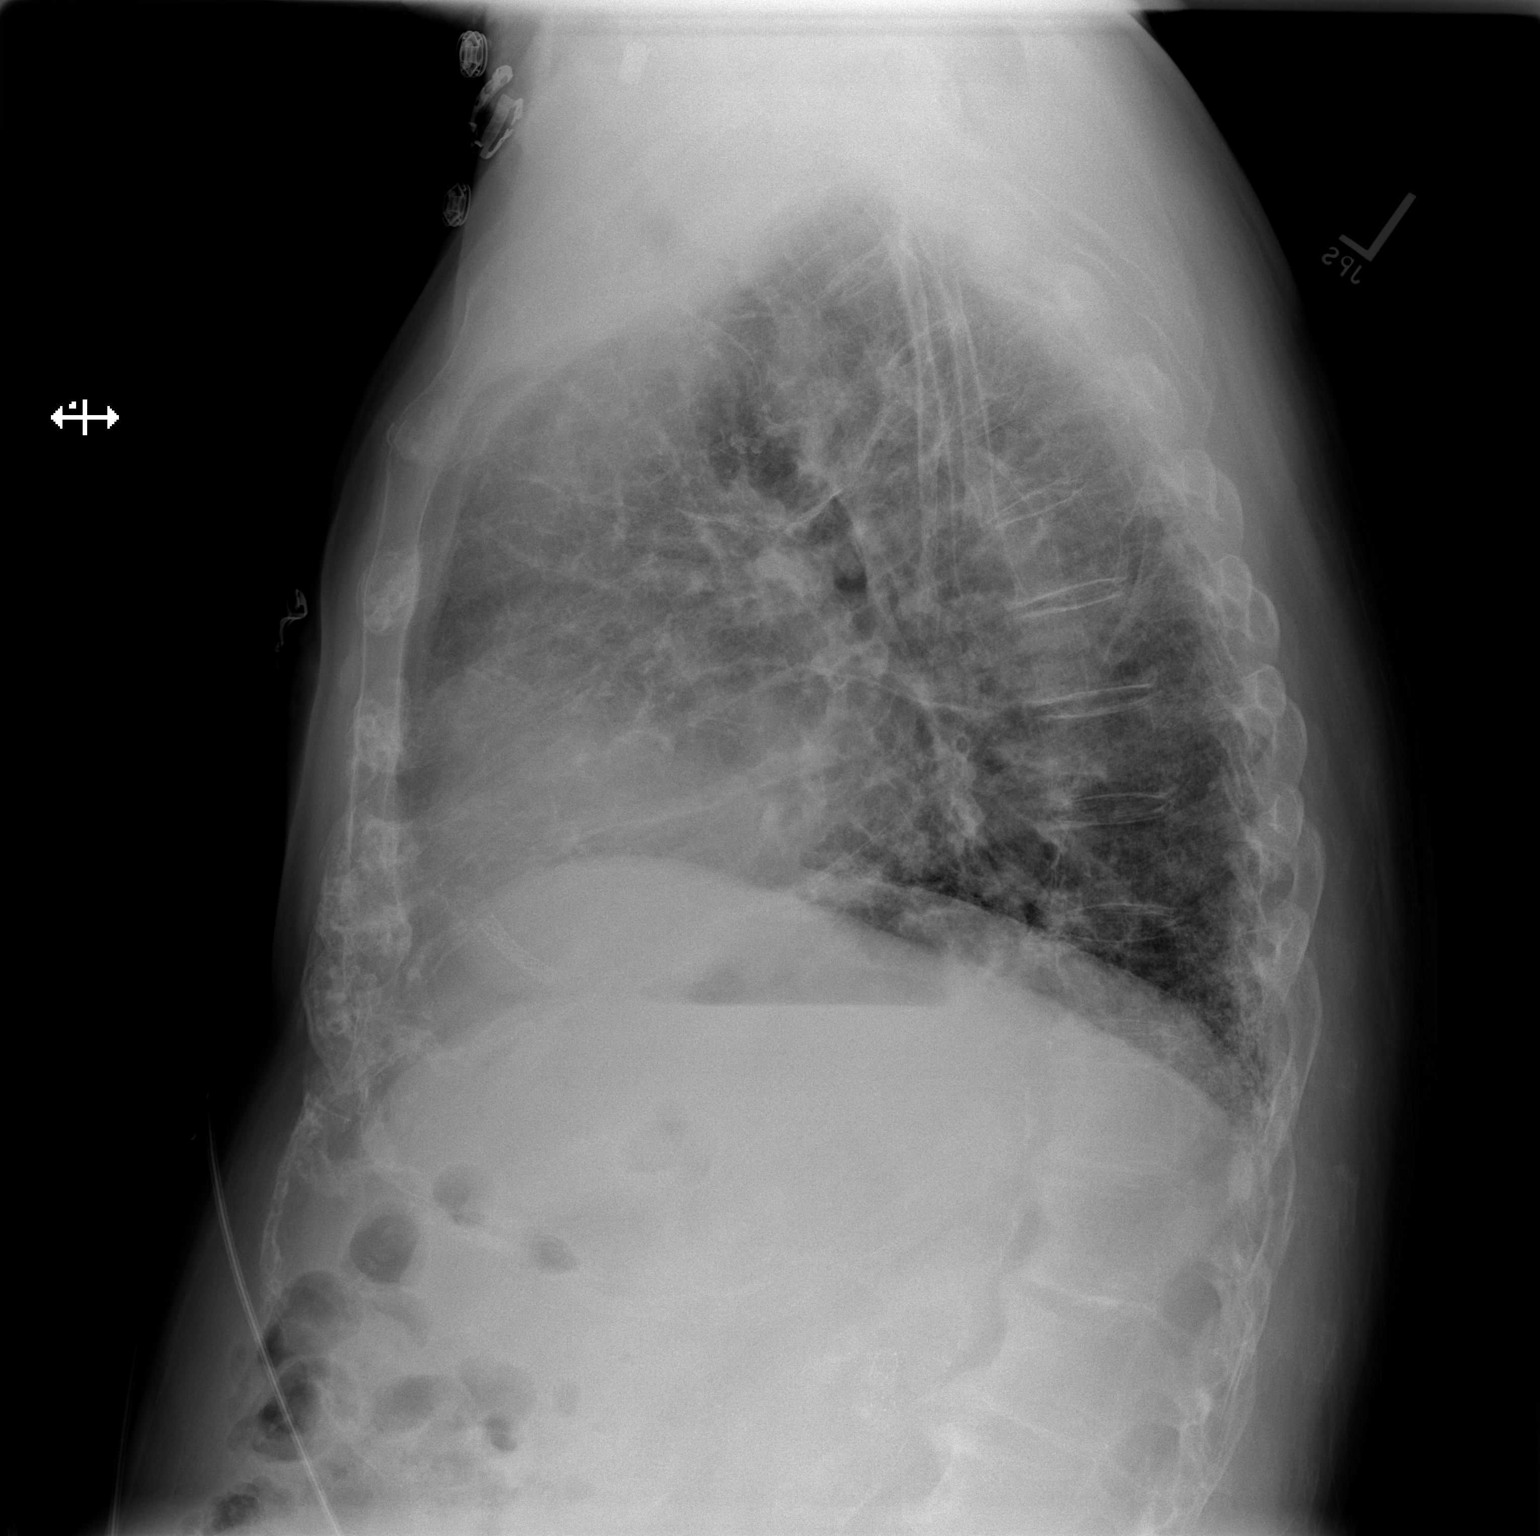

[2 of 2 positions shown; findings below may reference images not displayed]

FINDINGS: The heart is mildly enlarged.  Slight increase in the
diffuse interstitial pattern persists.  No significant effusions
are present.  Degenerative changes of the thoracolumbar spine are
evident.
IMPRESSION: 1.  Persistent slight increased interstitial pattern suggesting
mild edema or inflammation superimposed on chronic interstitial
coarsening.
2.  Minimal bibasilar atelectasis without significant airspace
consolidation.
3.  Degenerative changes of the thoracolumbar spine.

## 2014-09-22 IMAGING — CR DG CHEST 2V
2 series · 2 of 2 positions shown · non-contrast
Comparison: 02/20/2013

CLINICAL DATA: Dysphagia, follow up pneumonia

CHEST - 2 VIEW

[view not recorded (1 of 2)]
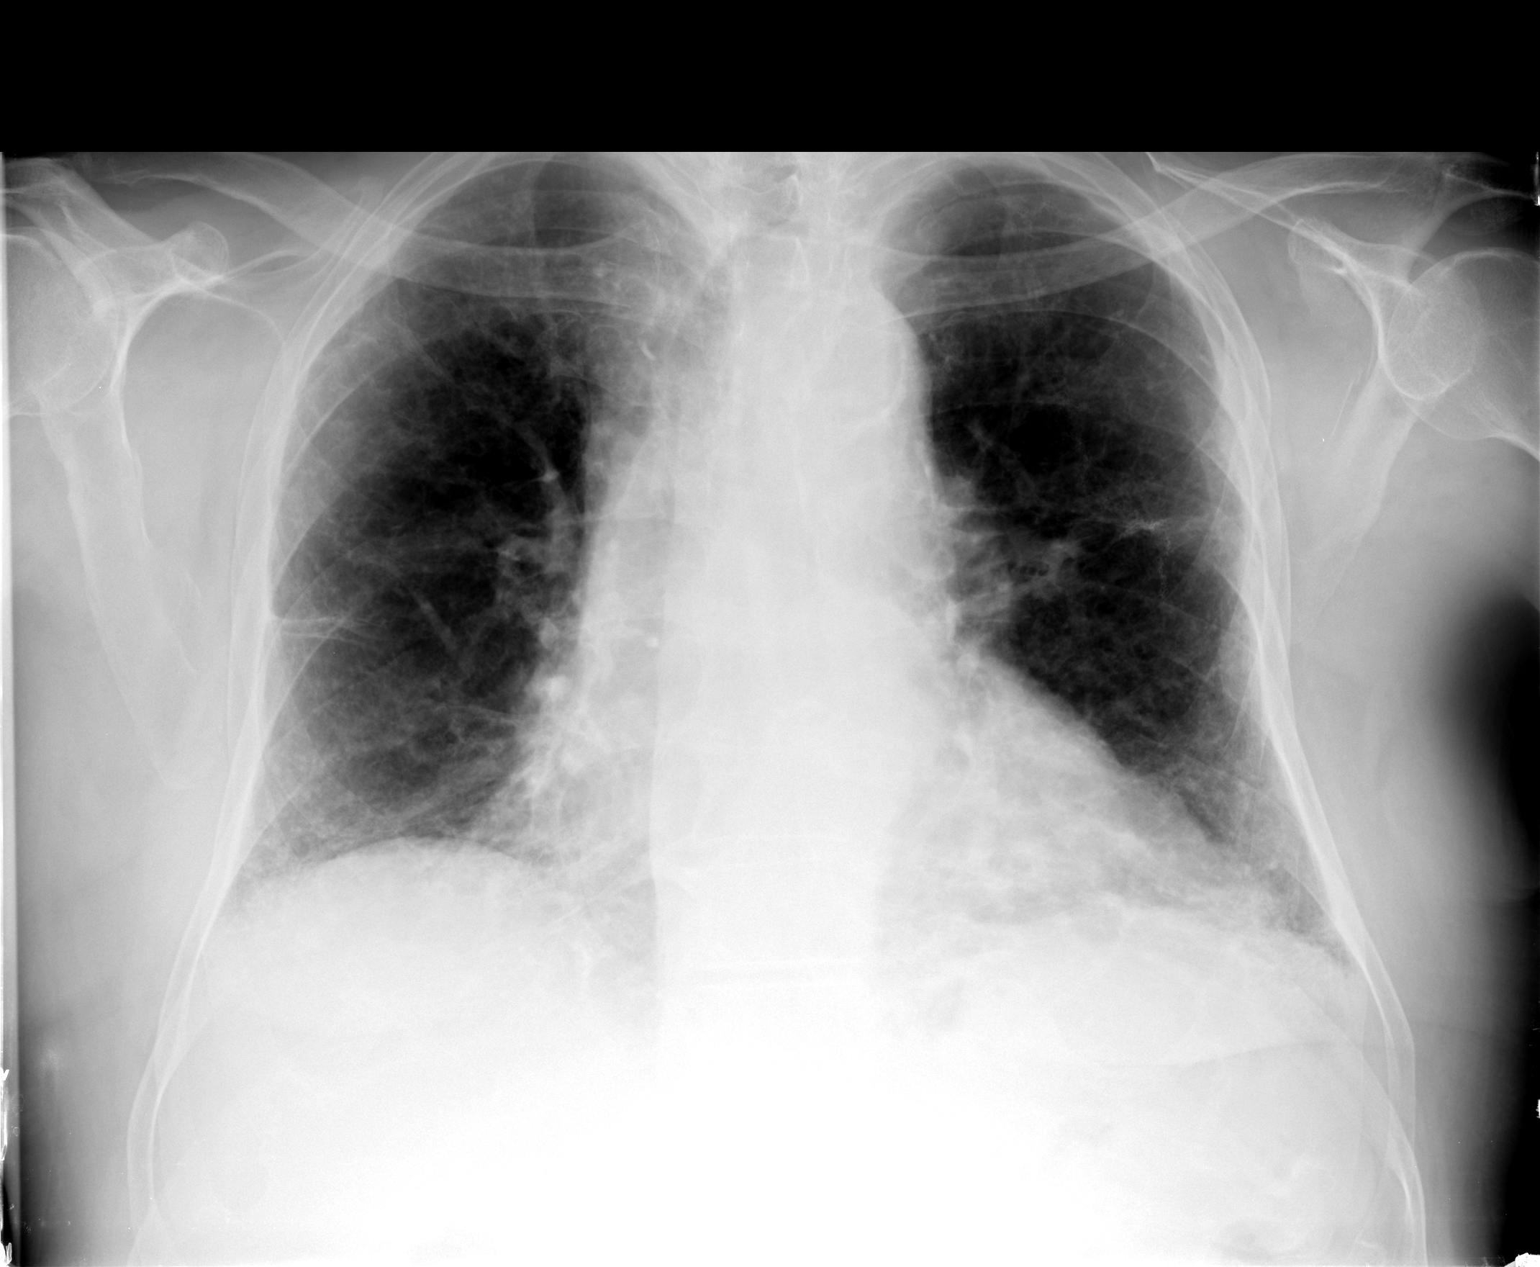

[view not recorded (2 of 2)]
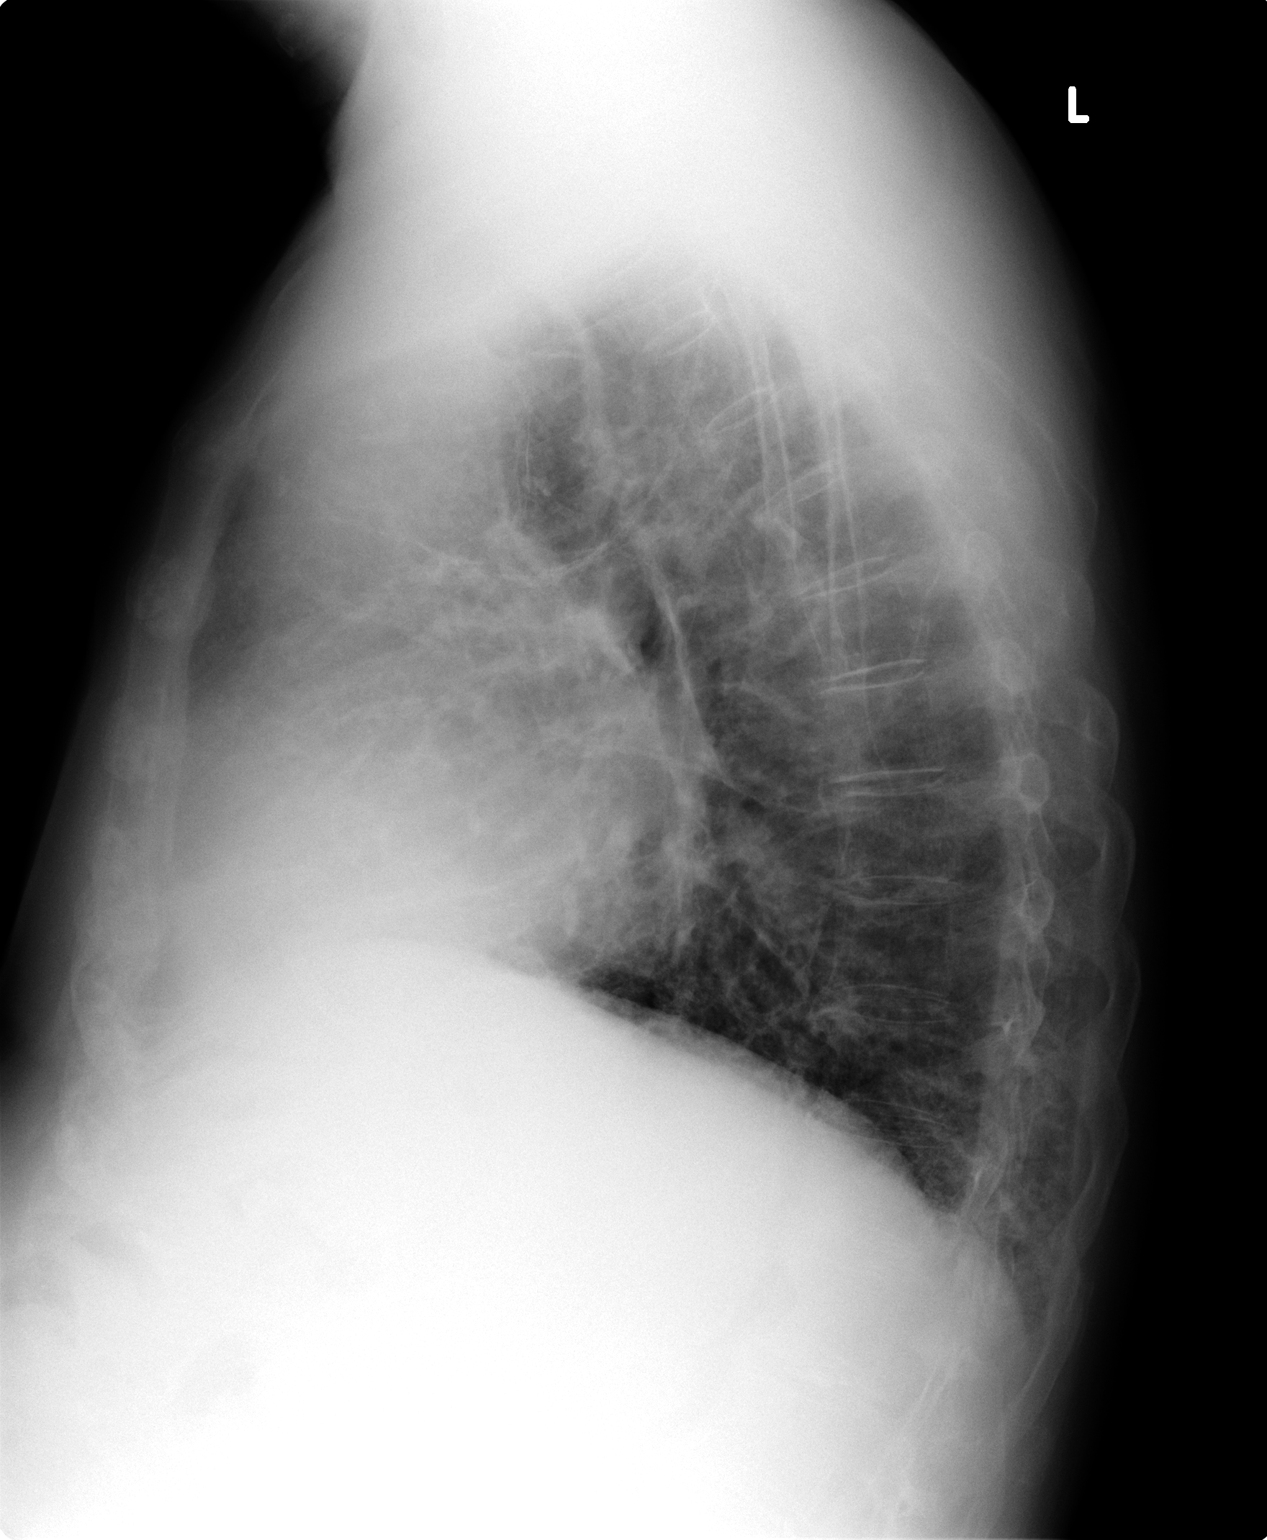

[2 of 2 positions shown; findings below may reference images not displayed]

FINDINGS: Coarse peripheral interstitial markings most marked in
the lung bases  left greater than right, without convincing
interval change.  No focal infiltrate or superimposed edema.  No
effusion.  Heart size upper limits normal for technique.
Atheromatous aorta.  Regional bones unremarkable.
IMPRESSION: 1.  Chronic interstitial lung disease without acute or superimposed
abnormality.

## 2023-06-17 ENCOUNTER — Other Ambulatory Visit (HOSPITAL_BASED_OUTPATIENT_CLINIC_OR_DEPARTMENT_OTHER): Payer: Self-pay
